# Patient Record
Sex: Male | Born: 1980 | Race: Black or African American | Hispanic: No | Smoking: Never smoker
Health system: Southern US, Community
[De-identification: ages and names within clinical notes are randomized; demographics above are authoritative.]

## PROBLEM LIST (undated history)

## (undated) DIAGNOSIS — R479 Unspecified speech disturbances: Secondary | ICD-10-CM

## (undated) DIAGNOSIS — L910 Hypertrophic scar: Secondary | ICD-10-CM

## (undated) DIAGNOSIS — J45909 Unspecified asthma, uncomplicated: Secondary | ICD-10-CM

## (undated) HISTORY — DX: Hypertrophic scar: L91.0

## (undated) HISTORY — DX: Unspecified speech disturbances: R47.9

## (undated) HISTORY — DX: Unspecified asthma, uncomplicated: J45.909

---

## 2010-12-31 ENCOUNTER — Encounter: Payer: Self-pay | Admitting: Student

## 2010-12-31 ENCOUNTER — Emergency Department (HOSPITAL_BASED_OUTPATIENT_CLINIC_OR_DEPARTMENT_OTHER)
Admission: EM | Admit: 2010-12-31 | Discharge: 2011-01-01 | Disposition: A | Discharge status: Home / Self Care | Payer: Worker's Compensation | Attending: Emergency Medicine | Admitting: Emergency Medicine

## 2010-12-31 ENCOUNTER — Emergency Department (INDEPENDENT_AMBULATORY_CARE_PROVIDER_SITE_OTHER): Payer: Worker's Compensation

## 2010-12-31 DIAGNOSIS — X500XXA Overexertion from strenuous movement or load, initial encounter: Secondary | ICD-10-CM | POA: Insufficient documentation

## 2010-12-31 DIAGNOSIS — M25519 Pain in unspecified shoulder: Secondary | ICD-10-CM

## 2010-12-31 DIAGNOSIS — S4980XA Other specified injuries of shoulder and upper arm, unspecified arm, initial encounter: Secondary | ICD-10-CM | POA: Insufficient documentation

## 2010-12-31 DIAGNOSIS — Y9289 Other specified places as the place of occurrence of the external cause: Secondary | ICD-10-CM | POA: Insufficient documentation

## 2010-12-31 DIAGNOSIS — S46909A Unspecified injury of unspecified muscle, fascia and tendon at shoulder and upper arm level, unspecified arm, initial encounter: Secondary | ICD-10-CM | POA: Insufficient documentation

## 2010-12-31 DIAGNOSIS — Y9269 Other specified industrial and construction area as the place of occurrence of the external cause: Secondary | ICD-10-CM

## 2010-12-31 NOTE — ED Notes (Signed)
Pt reports he is scheduled to have a UDS performed in the morning at another facility. Pt is here tonight for left shoulder pain that was injured while at work .

## 2010-12-31 NOTE — ED Notes (Signed)
WC - pt in with c/o left shoulder pain s/p lifting injury at work tonight. Reports pain to neck as well.

## 2011-01-01 MED ORDER — TRAMADOL HCL 50 MG PO TABS: 50.0000 mg | ORAL_TABLET | Freq: Four times a day (QID) | ORAL | Status: AC | PRN

## 2011-01-01 MED ORDER — TRAMADOL HCL 50 MG PO TABS
50.0000 mg | ORAL_TABLET | Freq: Once | ORAL | Status: AC
  Administered 2011-01-01: 50 mg via ORAL
  Filled 2011-01-01: qty 1

## 2011-01-01 NOTE — ED Provider Notes (Signed)
History     CSN: [id number] Arrival date & time: 12/31/2010 11:03 PM  Chief Complaint  Patient presents with  . Shoulder Injury   Patient is a 30 y.o. male presenting with shoulder injury. The history is provided by the patient. No language interpreter was used.  Shoulder Injury This is a new problem. The current episode started 1 to 2 hours ago. The problem occurs constantly. The problem has not changed since onset.Pertinent negatives include no chest pain, no abdominal pain, no headaches and no shortness of breath. The symptoms are aggravated by nothing. The symptoms are relieved by nothing. He has tried nothing for the symptoms. The treatment provided no relief.  Lifting a 50 pound pallette of fruit at work and heard a pop in the left shoulder.  Pain is a 10/10 no weakness or numbness.  No head injury no additional injuries.    History reviewed. No pertinent past medical history.  History reviewed. No pertinent past surgical history.  History reviewed. No pertinent family history.  History  Substance Use Topics  . Smoking status: Never Smoker   . Smokeless tobacco: Never Used  . Alcohol Use: Yes      Review of Systems  Constitutional: Negative for activity change.  HENT: Negative for facial swelling.   Eyes: Negative for discharge.  Respiratory: Negative for shortness of breath.   Cardiovascular: Negative for chest pain.  Gastrointestinal: Negative for abdominal pain.  Genitourinary: Negative for difficulty urinating.  Musculoskeletal: Positive for arthralgias. Negative for myalgias, back pain and joint swelling.  Neurological: Negative for headaches.  Hematological: Negative for adenopathy.  Psychiatric/Behavioral: Negative for agitation.    Physical Exam  BP 118/99  Pulse 75  Temp(Src) 98 F (36.7 C) (Oral)  Resp 20  SpO2 100%  Physical Exam  Constitutional: He appears well-developed and well-nourished. No distress.  HENT:  Head: Normocephalic and atraumatic.   Eyes: EOM are normal. Pupils are equal, round, and reactive to light.  Neck: Normal range of motion. Neck supple.  Cardiovascular: Normal rate and regular rhythm.   Pulmonary/Chest: Effort normal and breath sounds normal.  Abdominal: Soft. Bowel sounds are normal.  Musculoskeletal: Normal range of motion. He exhibits no edema.       Left shoulder: He exhibits pain. He exhibits no swelling, no effusion, no crepitus, no deformity, no laceration, normal pulse and normal strength.       Negative NEER test of the L shoulder, intact sensation and motor 5/5 of B UE.  2+ bicep/ tricep and brachioradialis reflexes of the LUE. Cap refill in the L hand < 2 sec  No snuff box tenderness of the left wrist    ED Course  Procedures  MDM Need MRI of the left shoulder and orthopedics follow up.  Patient and supervisor express understanding and will follow up with occ med in the AM and appropriate doctors      Carlosdaniel Grob K Spenser Cong-Rasch, MD 01/01/11 [phone]

## 2011-01-01 NOTE — ED Notes (Signed)
Pt's E-prescription was sent to a pharmacy in [place] that pt no longer goes to. Pt states he is no longer going there, and is requesting prescription to be called in to pharmacy in [place]. PT made aware that the closest 24hour [facility] to call in prescription to is in [facility]. Call made and Rx called in. Pt made aware to pick it up at [facility], and that other Rx will be canceled at [place] in the morning once hours are open.

## 2012-03-24 ENCOUNTER — Ambulatory Visit (INDEPENDENT_AMBULATORY_CARE_PROVIDER_SITE_OTHER): Payer: BC Managed Care – PPO | Admitting: Physician Assistant

## 2012-03-24 VITALS — BP 124/72 | HR 67 | Temp 98.1°F | Resp 16 | Ht 68.0 in | Wt 180.0 lb

## 2012-03-24 DIAGNOSIS — S335XXA Sprain of ligaments of lumbar spine, initial encounter: Secondary | ICD-10-CM

## 2012-03-24 DIAGNOSIS — M545 Low back pain: Secondary | ICD-10-CM

## 2012-03-24 DIAGNOSIS — R479 Unspecified speech disturbances: Secondary | ICD-10-CM | POA: Insufficient documentation

## 2012-03-24 DIAGNOSIS — L910 Hypertrophic scar: Secondary | ICD-10-CM | POA: Insufficient documentation

## 2012-03-24 DIAGNOSIS — S39012A Strain of muscle, fascia and tendon of lower back, initial encounter: Secondary | ICD-10-CM

## 2012-03-24 MED ORDER — CYCLOBENZAPRINE HCL 10 MG PO TABS: 10.0000 mg | ORAL_TABLET | Freq: Three times a day (TID) | ORAL | Status: DC | PRN

## 2012-03-24 NOTE — Patient Instructions (Signed)
The cyclobenzaprine (Flexeril) can cause drowsiness, so you should not take it and work or drive.  Use acetaminophen for pain, and apply a warm compress for 15-20 minutes several times each day.  You may also want to consider using an adhesive heating pad, like ThermaCare.
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
## 2012-09-26 IMAGING — CR DG SHOULDER 2+V*L*
3 series · 3 of 3 positions shown · non-contrast
Comparison: None.

CLINICAL DATA: Left shoulder popped while lifting box at work;
diffuse left shoulder pain.

LEFT SHOULDER - 2+ VIEW

[w shoulder ap internal left]
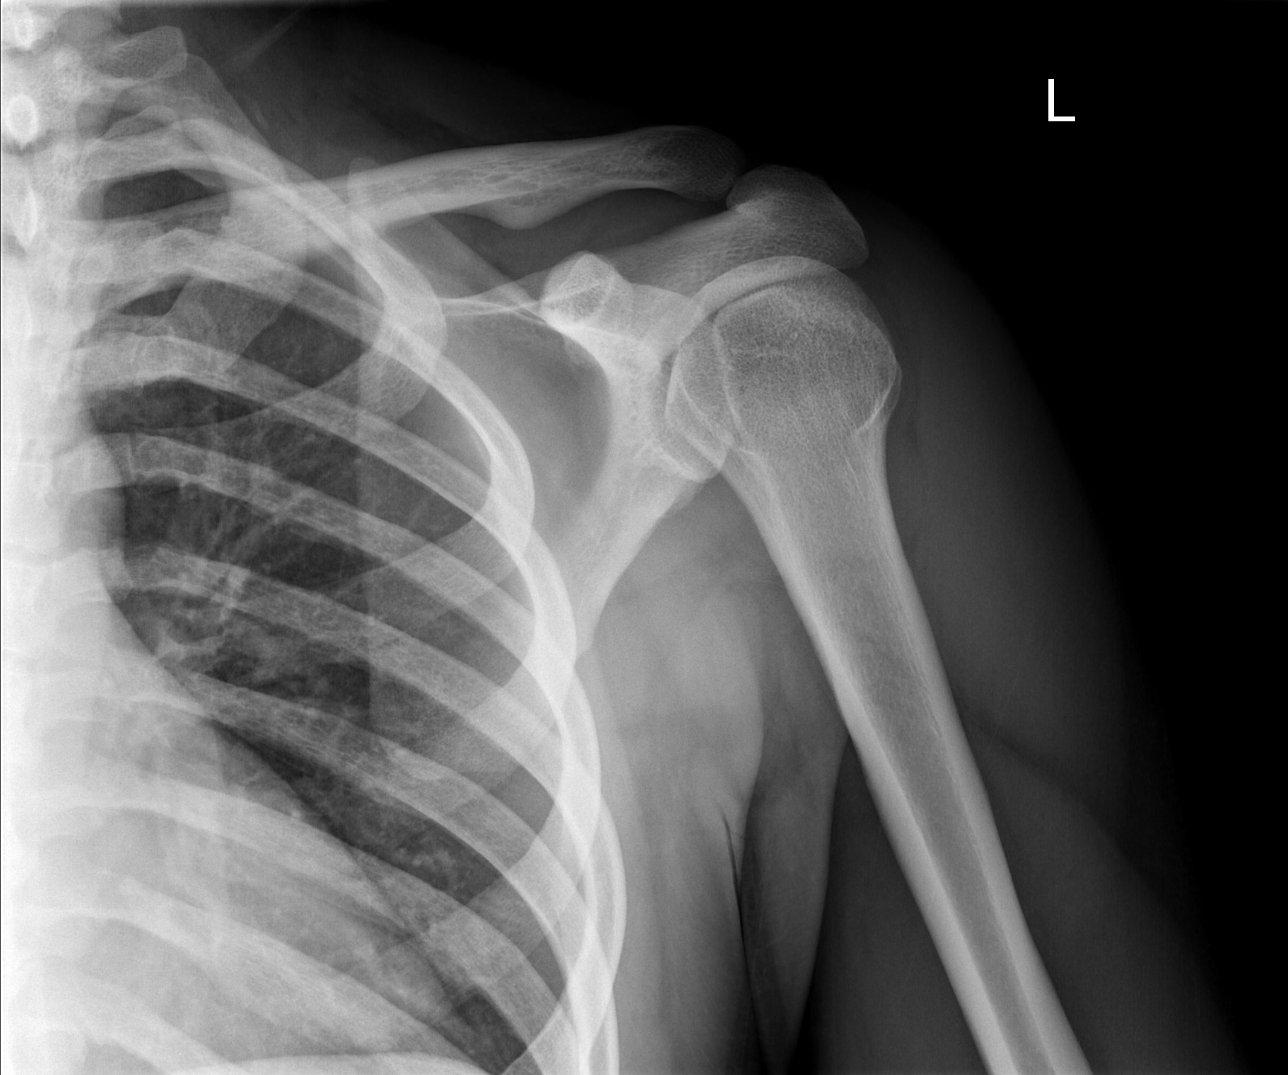

[w shoulder ap external left]
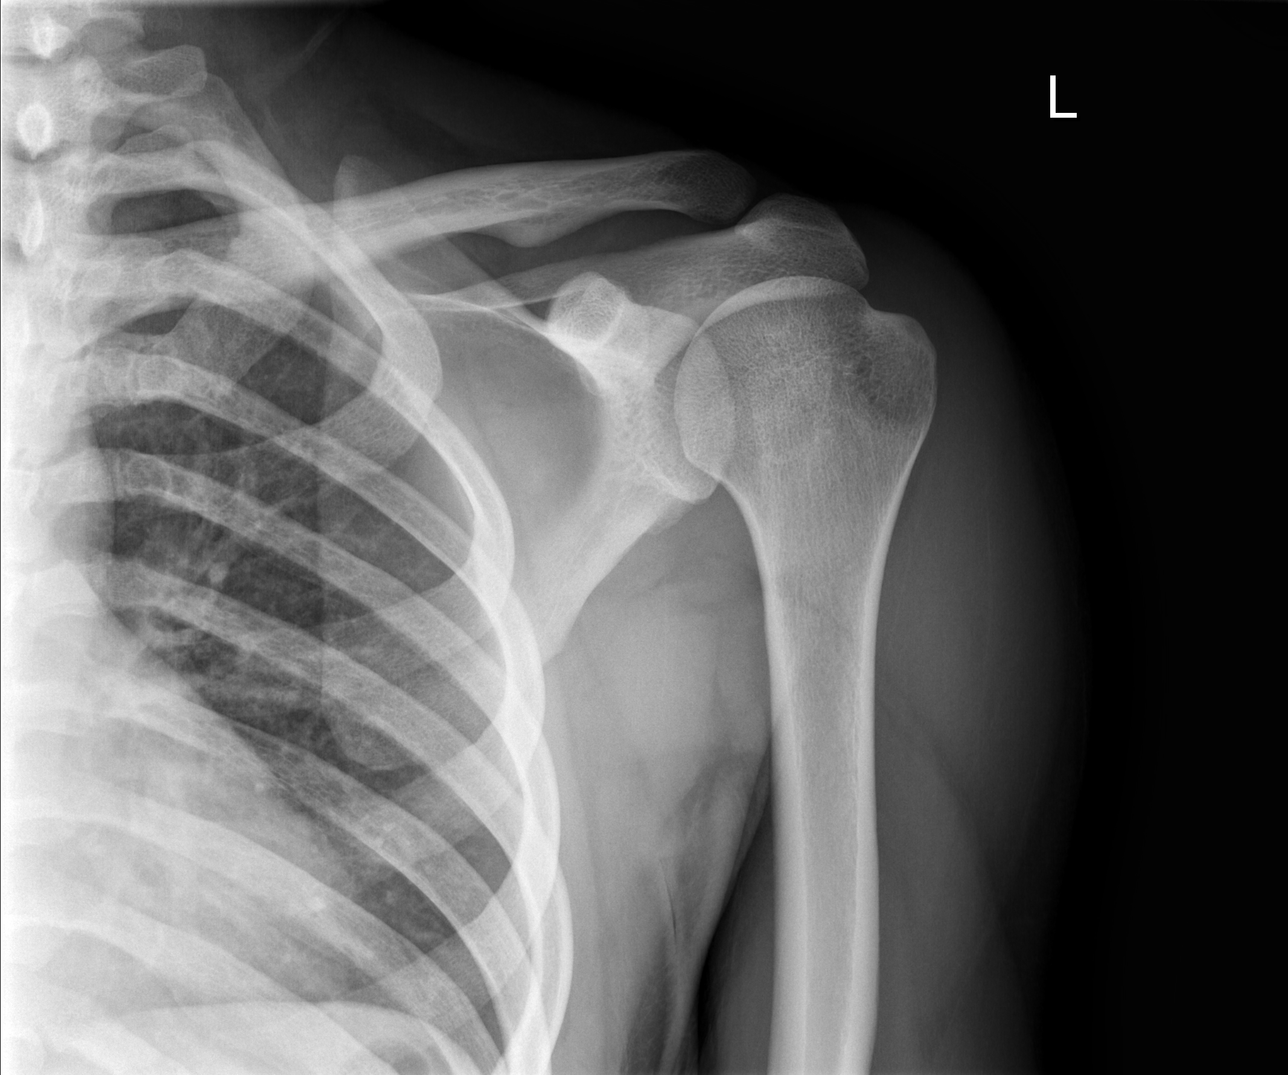

[x shoulder axillary left]
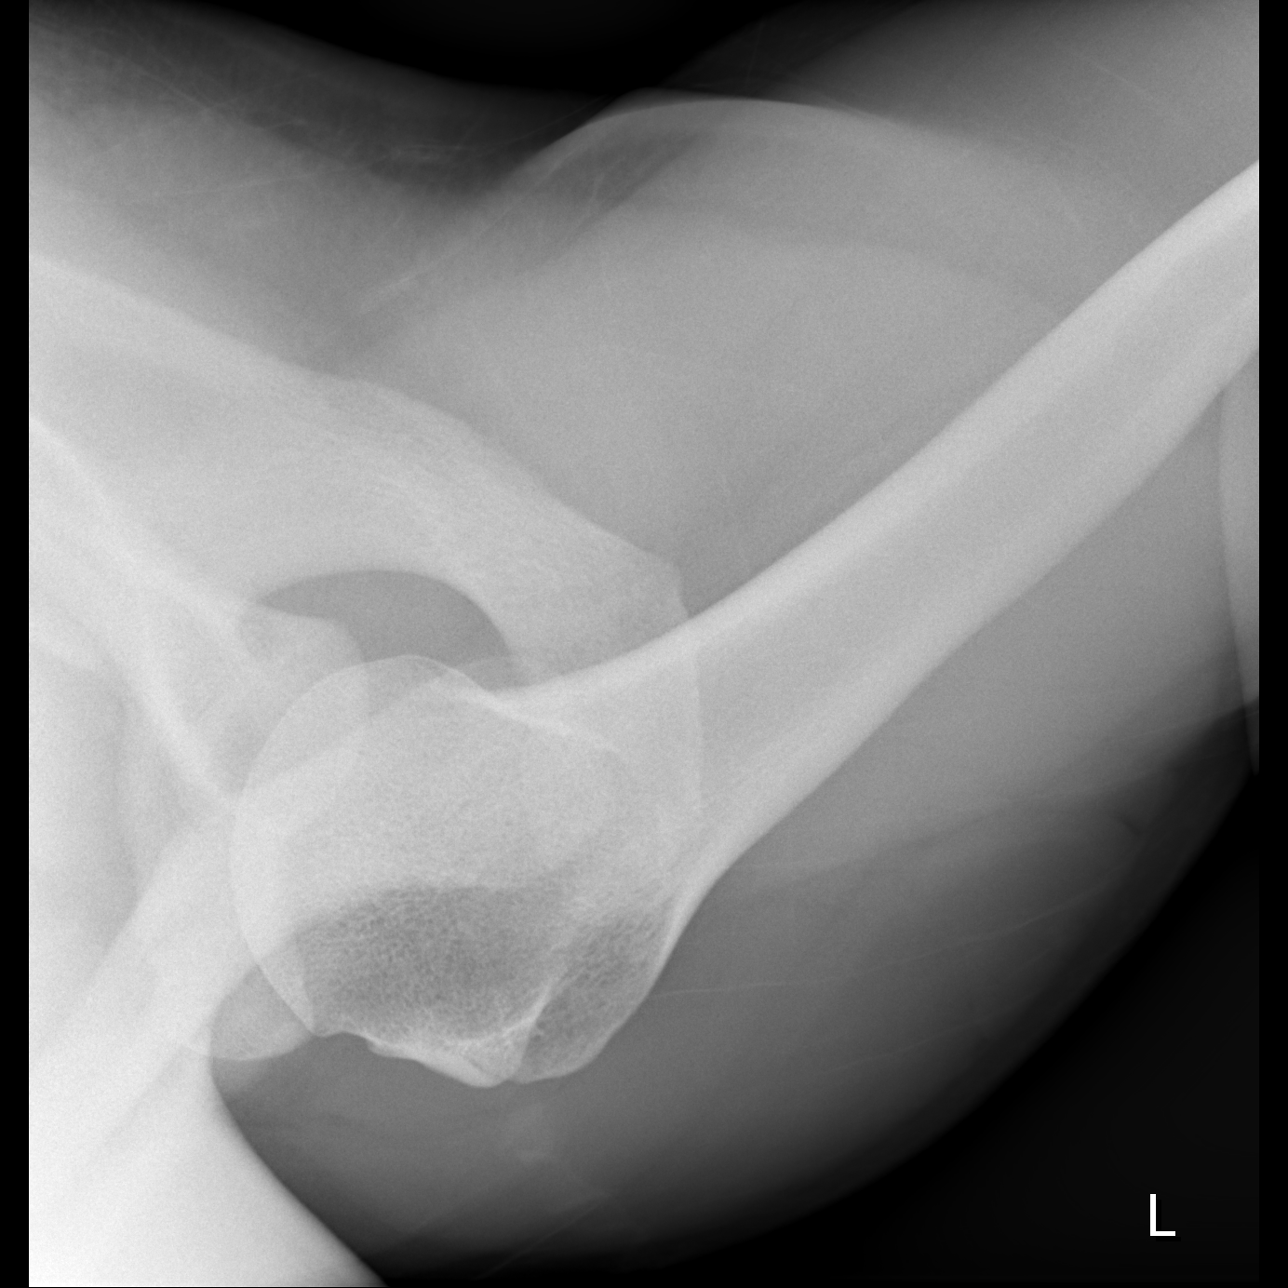

[3 of 3 positions shown; findings below may reference images not displayed]

FINDINGS: There is no evidence of fracture or dislocation.  The
left humeral head is seated within the glenoid fossa.  The
acromioclavicular joint is unremarkable in appearance.  No
significant soft tissue abnormalities are seen.  The visualized
portions of the left lung are clear.
IMPRESSION: No evidence of fracture or dislocation.  Given the clinical
presentation, underlying internal derangement of the shoulder is
suspected.  MRI would be helpful for further evaluation, when and
as deemed clinically appropriate.

## 2013-10-05 ENCOUNTER — Emergency Department (HOSPITAL_COMMUNITY): Payer: Worker's Compensation

## 2013-10-05 ENCOUNTER — Emergency Department (HOSPITAL_COMMUNITY)
Admission: EM | Admit: 2013-10-05 | Discharge: 2013-10-05 | Disposition: A | Discharge status: Home / Self Care | Payer: Worker's Compensation | Attending: Emergency Medicine | Admitting: Emergency Medicine

## 2013-10-05 ENCOUNTER — Encounter (HOSPITAL_COMMUNITY): Payer: Self-pay | Admitting: Emergency Medicine

## 2013-10-05 DIAGNOSIS — Z79899 Other long term (current) drug therapy: Secondary | ICD-10-CM | POA: Insufficient documentation

## 2013-10-05 DIAGNOSIS — N451 Epididymitis: Secondary | ICD-10-CM

## 2013-10-05 DIAGNOSIS — F8089 Other developmental disorders of speech and language: Secondary | ICD-10-CM | POA: Insufficient documentation

## 2013-10-05 DIAGNOSIS — J45909 Unspecified asthma, uncomplicated: Secondary | ICD-10-CM | POA: Insufficient documentation

## 2013-10-05 DIAGNOSIS — N433 Hydrocele, unspecified: Secondary | ICD-10-CM

## 2013-10-05 DIAGNOSIS — N453 Epididymo-orchitis: Secondary | ICD-10-CM | POA: Insufficient documentation

## 2013-10-05 DIAGNOSIS — Z872 Personal history of diseases of the skin and subcutaneous tissue: Secondary | ICD-10-CM | POA: Insufficient documentation

## 2013-10-05 LAB — URINE MICROSCOPIC-ADD ON

## 2013-10-05 LAB — URINALYSIS, ROUTINE W REFLEX MICROSCOPIC
Bilirubin Urine: NEGATIVE
Glucose, UA: NEGATIVE mg/dL
Hgb urine dipstick: NEGATIVE
Ketones, ur: NEGATIVE mg/dL
NITRITE: NEGATIVE
Protein, ur: NEGATIVE mg/dL
SPECIFIC GRAVITY, URINE: 1.025 (ref 1.005–1.030)
UROBILINOGEN UA: 1 mg/dL (ref 0.0–1.0)
pH: 7 (ref 5.0–8.0)

## 2013-10-05 MED ORDER — CEFTRIAXONE SODIUM 250 MG IJ SOLR
250.0000 mg | Freq: Once | INTRAMUSCULAR | Status: AC
  Administered 2013-10-05: 250 mg via INTRAMUSCULAR
  Filled 2013-10-05: qty 250

## 2013-10-05 MED ORDER — IBUPROFEN 800 MG PO TABS
800.0000 mg | ORAL_TABLET | Freq: Once | ORAL | Status: AC
  Administered 2013-10-05: 800 mg via ORAL
  Filled 2013-10-05: qty 1

## 2013-10-05 MED ORDER — LIDOCAINE HCL (PF) 1 % IJ SOLN
INTRAMUSCULAR | Status: AC
  Administered 2013-10-05: 5 mL
  Filled 2013-10-05: qty 5

## 2013-10-05 MED ORDER — DOXYCYCLINE HYCLATE 100 MG PO CAPS: 100.0000 mg | ORAL_CAPSULE | Freq: Two times a day (BID) | ORAL | Status: DC

## 2013-10-05 MED ORDER — DOXYCYCLINE HYCLATE 100 MG PO TABS
100.0000 mg | ORAL_TABLET | Freq: Once | ORAL | Status: AC
  Administered 2013-10-05: 100 mg via ORAL
  Filled 2013-10-05: qty 1

## 2013-10-05 NOTE — ED Notes (Signed)
Pt. Stated, I've had testicular pain and swelling for the last 5 days. I've put ice on it and doesn't help.

## 2013-10-05 NOTE — Discharge Instructions (Signed)
Scrotal US results:  Right epididymis: The tail is enlarged, heterogeneous, and hyperemic.  Left epididymis: Normal in size and appearance.  Hydrocele: Small, right  Varicocele: None visualized.  Pulsed Doppler interrogation of both testes demonstrates low  resistance arterial and venous waveforms bilaterally.  IMPRESSION:  1. Right epididymitis, with small hydrocele.  2. Normal bilateral testes.     - Try elevating your testicles in a position of comfort (see below) - Ibuprofen for pain - Take full dose of antibiotics even if your symptoms improve - Follow up with urology  - Return to the emergency department if you develop any changing/worsening condition, fever, abdominal pain, repeated vomiting, not feeling well or any other concerns (please read additional information regarding your condition below)    Epididymitis Epididymitis is a swelling (inflammation) of the epididymis. The epididymis is a cord-like structure along the back part of the testicle. Epididymitis is usually, but not always, caused by infection. This is usually a sudden problem beginning with chills, fever and pain behind the scrotum and in the testicle. There may be swelling and redness of the testicle. DIAGNOSIS  Physical examination will reveal a tender, swollen epididymis. Sometimes, cultures are obtained from the urine or from prostate secretions to help find out if there is an infection or if the cause is a different problem. Sometimes, blood work is performed to see if your white blood cell count is elevated and if a germ (bacterial) or viral infection is present. Using this knowledge, an appropriate medicine which kills germs (antibiotic) can be chosen by your caregiver. A viral infection causing epididymitis will most often go away (resolve) without treatment. HOME CARE INSTRUCTIONS   Hot sitz baths for 20 minutes, 4 times per day, may help relieve pain.  Only take over-the-counter or prescription  medicines for pain, discomfort or fever as directed by your caregiver.  Take all medicines, including antibiotics, as directed. Take the antibiotics for the full prescribed length of time even if you are feeling better.  It is very important to keep all follow-up appointments. SEEK IMMEDIATE MEDICAL CARE IF:   You have a fever.  You have pain not relieved with medicines.  You have any worsening of your problems.  Your pain seems to come and go.  You develop pain, redness, and swelling in the scrotum and surrounding areas. MAKE SURE YOU:   Understand these instructions.  Will watch your condition.  Will get help right away if you are not doing well or get worse. Document Released: 04/25/2000 Document Revised: 07/21/2011 Document Reviewed: 03/15/2009 [facility] Patient Information 2014 [place], [place].  Hydrocele, Adult Fluid can collect around the testicles. This fluid forms in a sac. This condition is called a hydrocele. The collected fluid causes swelling of the scrotum. Usually, it affects just one testicle. Most of the time, the condition does not cause pain. Sometimes, the hydrocele goes away on its own. Other times, surgery is needed to get rid of the fluid. CAUSES A hydrocele does not develop often. Different things can cause a hydrocele in a man, including:  Injury to the scrotum.  Infection.  X-ray of the area around the scrotum.  A tumor or cancer of the testicle.  Twisting of a testicle.  Decreased blood flow to the scrotum. SYMPTOMS   Swelling without pain. The hydrocele feels like a water-filled balloon.  Swelling with pain. This can occur if the hydrocele was caused by infection or twisting.  Mild discomfort in the scrotum.  The hydrocele  may feel heavy.  Swelling that gets smaller when you lie down. DIAGNOSIS  Your caregiver will do a physical exam to decide if you have a hydrocele. This may include:  Asking questions about your overall health,  today and in the past. Your caregiver may ask about any injuries, X-rays, or infections.  Pushing on your abdomen or asking you to change positions to see if the size of the hydrocele changes.  Shining a light through the scrotum (transillumination) to see if the fluid inside the scrotum is clear.  Blood tests and urine tests to check for infection.  Imaging studies that take pictures of the scrotum and testicles. TREATMENT  Treatment depends in part on what caused the condition. Options include:  Watchful waiting. Your caregiver checks the hydrocele every so often.  Different surgeries to drain the fluid.  A needle may be put into the scrotum to drain fluid (needle aspiration). Fluid often returns after this type of treatment.  A cut (incision) may be made in the scrotum to remove the fluid sac (hydrocelectomy).  An incision may be made in the groin to repair a hydrocele that has contact with abdominal fluids (communicating hydrocele).  Medicines to treat an infection (antibiotics). HOME CARE INSTRUCTIONS  What you need to do at home may depend on the cause of the hydrocele and type of treatment. In general:  Take all medicine as directed by your caregiver. Follow the directions carefully.  Ask your caregiver if there is anything you should not do while you recover (activities, lifting, work, sex).  If you had surgery to repair a communicating hydrocele, recovery time may vary. Ask you caregiver about your recovery time.  Avoid heavy lifting for 4 to 6 weeks.  If you had an incision on the scrotum or groin, wash it for 2 to 3 days after surgery. Do this as long as the skin is closed and there are no gaps in the wound. Wash gently, and avoid rubbing the incision.  Keep all follow-up appointments. SEEK MEDICAL CARE IF:   Your scrotum seems to be getting larger.  The area becomes more and more uncomfortable. SEEK IMMEDIATE MEDICAL CARE IF:  You have a fever. Document  Released: 10/16/2009 Document Revised: 02/16/2013 Document Reviewed: 10/16/2009 [facility] Patient Information 2014 [place], [place].   Emergency Department Resource Guide 1) Find a Doctor and Pay Out of Pocket Although you won't have to find out who is covered by your insurance plan, it is a good idea to ask around and get recommendations. You will then need to call the office and see if the doctor you have chosen will accept you as a new patient and what types of options they offer for patients who are self-pay. Some doctors offer discounts or will set up payment plans for their patients who do not have insurance, but you will need to ask so you aren't surprised when you get to your appointment.  2) Contact Your Local Health Department Not all health departments have doctors that can see patients for sick visits, but many do, so it is worth a call to see if yours does. If you don't know where your local health department is, you can check in your phone book. The CDC also has a tool to help you locate your state's health department, and many state websites also have listings of all of their local health departments.  3) Find a Walk-in Clinic If your illness is not likely to be very severe or complicated, you  may want to try a walk in clinic. These are popping up all over the country in pharmacies, drugstores, and shopping centers. They're usually staffed by nurse practitioners or physician assistants that have been trained to treat common illnesses and complaints. They're usually fairly quick and inexpensive. However, if you have serious medical issues or chronic medical problems, these are probably not your best option.  No Primary Care Doctor: - Call Health Connect at  [phone] - they can help you locate a primary care doctor that  accepts your insurance, provides certain services, etc. - Physician Referral Service- [phone]  Chronic Pain Problems: Organization         Address  Phone    Notes  [facility]  [phone]) [phone] Patients need to be referred by their primary care doctor.   Medication Assistance: Organization         Address  Phone   Notes  [facility] Medication [facility] [address] [place], [place] [address] [phone] --Must be a resident of [facility] [facility] [place] -- Must have NO insurance coverage whatsoever (no Medicaid/ Medicare, etc.) -- The pt. MUST have a primary care doctor that directs their care regularly and follows them in the community   [facility]ist  [phone]   [employer]  [phone]    Agencies that provide inexpensive medical care: Organization         Address  Phone   Notes  [facility]  [phone]   [facility]    [phone](336) [phone]   [facility] [address] [place], [place] [address] [phone]) [phone]   [facility] [address], [place] [phone]   Planned Parenthood    [phone]   [facility]    [phone]   [facility]  [address], [place] Phone:  [phone]) [phone], Fax:  [phone] Hours of Operation:  9 am - 6 pm, M-F.  Also accepts Medicaid/Medicare and self-pay.  [facility]  [address], [place] Phone: [phone], Fax: [phone]. Hours of Operation:  8:30 am - 5:30 pm, M-F.  Also accepts Medicaid and self-pay.  [facility] [place] [address], [place] Phone: [phone]) [phone]   [facility] [address], [place], [place] [phone], Ext. [phone] Mondays & Thursdays: 7-9 AM.  First 15 patients are seen on a first come, first serve basis.    Medicaid-accepting [facility] Providers:  Organization         Address  Phone   Notes  [facility] [address], [place] [phone] Also accepts self-pay patients.  [facility]  [address], [place]  [phone](336) [phone]   [facility] [address], [place] [phone]   [facility] [address] [place] [address], [place] [phone]   Renaye RakersVeita Bland [address], [place]   [phone]) [phone] Only accepts [place] Access [place] patients after they have their name applied to their card.   Self-Pay (no insurance) in [facility]:  Organization         Address  Phone   Notes  Sickle Cell Patients, [facility] [address], [place] [phone]) [phone]   [facility] [address] [place] [address], [place] (  336) [phone]   [facility] [place]  [address], [place] [phone]   [facility]/Dr. Osei-Bonsu  [address] [place] or [address], [place] [phone] Phone number for both [place] and [place] locations is the same.  [facility] [address], [place] [phone]   [facility] [address], [place] or [address] [phone]   [facility] [address] [place] [phone], phone; [phone], fax Sees patients 1st and 3rd Saturday of every month.  Must not qualify for public or private insurance (i.e. Medicaid, Medicare, [place] Health Choice, Veterans' Benefits)  Household income should be no more than 200% of the poverty level The clinic cannot treat you if you are pregnant or think you are pregnant  Sexually transmitted diseases are not treated at the clinic.    Dental Care: Organization         Address  Phone  Notes  [facility] [address] [place], [place] [phone] Accepts children up to age 34 who are enrolled in [place] or [place] Health Choice; pregnant women with a Medicaid card; and children who have  applied for Medicaid or [place] Health Choice, but were declined, whose parents can pay a reduced fee at time of service.  [facility]  [address], [place] [phone] Accepts children up to age 66 who are enrolled in [place] or [place] Health Choice; pregnant women with a Medicaid card; and children who have applied for Medicaid or [place] Health Choice, but were declined, whose parents can pay a reduced fee at time of service.  [facility]  [address] [place], [place] [phone] Patients are seen by appointment only. Walk-ins are not accepted. [facility] will see patients 58 years of age and older. Monday - Tuesday (8am-5pm) Most Wednesdays (8:30-5pm) $30 per visit, cash only  [facility] Access PROGRAM  [address], [facility] [phone] Patients are seen by appointment only. Walk-ins are not accepted. [facility] will see patients 64 years of age and older. One Wednesday Evening (Monthly: Volunteer Based).  $30 per visit, cash only  [employer]  [phone] for adults; Children under age 49, call Graduate Pediatric Dentistry at [phone]. Children aged 48-14, please call [phone] to request a pediatric application.  Dental services are provided in all areas of dental care including fillings, crowns and bridges, complete and partial dentures, implants, gum treatment, root canals, and extractions. Preventive care is also provided. Treatment is provided to both adults and children. Patients are selected via a lottery and there is often a waiting list.   [facility] [address], [place]  [phone] [email or web address]   [facility] [address] [place], [place] [phone], Ext. [phone] Second and Fourth Thursday of each month, opens at 6:30 AM; Clinic ends at 9 AM.  Patients are seen on a first-come first-served basis, and a  limited number are seen during each clinic.   [facility]  [address] [place], [place] [phone]   Eligibility Requirements You must have lived in [place], [place], or [place] counties for at least the last three months.   You cannot be eligible for  state or Teacher, music, including [employer], [place], or [employer].   You generally cannot be eligible for healthcare insurance through your employer.    How to apply: Eligibility screenings are held every Tuesday and Wednesday afternoon from 1:00 pm until 4:00 pm. You do not need an appointment for the interview!  [facility] [address], [place], [place] [phone]   [facility] Health Department  [phone]   [facility] Health Department  [phone]   [facility] Health Department  [phone]    Behavioral Health Resources in the Community: Intensive Outpatient Programs Organization         Address  Phone  Notes  [facility] [address] [place] [employer] [place], [place] [phone]   [facility] [address], [place], [place] [phone]   [facility] [address] [place], [place]  [phone]   [facility] Mental Health [address]  [place], [place] [phone] or [phone]   Substance Abuse Resources Organization         Address  Phone  Notes  [facility] Drug [facility]  [phone]   [facility]  [phone]   [facility] [place]  [phone]   Floydene Flock  [phone]   Residential & Outpatient Substance Abuse Program  4791489386   Psychological Services Organization         Address  Phone  Notes  [facility] Behavioral Health  [phone]   [facility]  [phone]   [facility] Mental Health [address] [place] [phone] or [phone]    Mobile Crisis Teams Organization          Address  Phone  Notes  [facility], Mobile Crisis Care Unit  [phone]   [facility]  [address] [place], [place] [phone]   Doristine Locks [address] [place] [phone]    Self-Help/Support Groups Organization         Address  Phone             Notes  [facility] [place] - variety of support groups  [phone]- I7437963 Call for more information  Narcotics Anonymous (NA), [facility] [address], [employer] [place]  2 meetings at this location   Statistician         Address  Phone  Notes  [facility] [address],    [place]  [phone]   [facility]  [address], [place] [address], [place], [place] [phone]   [facility] Treatment Facility [address] [place], [place] [phone] Admissions: 8am-3pm M-F  [facility] [address]    [place], [place] [phone]   [facility] [address] [place], [place] [phone]   [facility] [address]  [place], [place] [phone]   [facility] - Intensive Outpatient [address], [place], [place] [phone]   [facility] (Addiction Recovery [facility] [address] [place]  [place], [place] [phone] or [phone]   [facility] (RTS) [address], [place], [place] [phone] Accepts Medicaid  [facility] [address]  [place] [phone] Substance Abuse/Addiction Treatment   [facility] Organization         Address  Phone  Notes  [facility]  [phone]   Angie Fava, PhD [address] [place], [place]   [phone]  [phone] or [phone]) [phone]   [facility]   [address] [place], [place] [phone]   [facility] Recovery [address] [place], [place] [phone] Insurance/Medicaid/sponsorship  through [facility] and Families [address]                                    [place], [place] [phone] Therapy/tele-psych/case  [facility] [address]   [place], [place] [phone]    Dr. Lolly Mustache  [phone]   [facility] [place]  [employer] [facility] Dept. 1) [address] [place] 2) [address] [place] 3)  [address], [place] [phone]  [phone]   [facility] Child Abuse Hotline [phone] or [phone] (After Hours)

## 2013-10-05 NOTE — ED Notes (Signed)
Pt not returned yet from [place], called [place], pt enroute back.

## 2013-10-05 NOTE — ED Provider Notes (Signed)
CSN: [id number]     Arrival date & time 10/05/13  1750 History   First MD Initiated Contact with Patient 10/05/13 2018     Chief Complaint  Patient presents with  . Testicle Pain  . Groin Swelling   HPI  Braeden Elsie LincolnGamble is a 33 y.o. male with a PMH of asthma, keloid skin disorder, and speech impediment who presents to the ED for evaluation of testicular pain and groin swelling. History was provided by the patient. Patient has had gradually worsening constant right testicular pain for the past 5 days which is described as a throbbing pain. Pain worse with walking and palpation. Tried applying ice with no improvements. No genital sores, dysuria, abdominal pain, penile pain or discharge. No trauma or injuries. Associated symptoms include scrotal edema. No previous hx of this in the past. Patient currently sexually active with one male partner with no recent new partners. No concerns or hx of STD's. No fever, chills, change in appetite/activity, nausea, vomiting or other concerns.    Past Medical History  Diagnosis Date  . Asthma   . Speech impediment   . Keloid skin disorder     nuchal region   History reviewed. No pertinent past surgical history. Family History  Problem Relation Age of Onset  . Diabetes Mother   . Cancer Maternal Grandmother     breast cancer x 2   History  Substance Use Topics  . Smoking status: Never Smoker   . Smokeless tobacco: Never Used  . Alcohol Use: 1.2 oz/week    2 Cans of beer per week    Review of Systems  Constitutional: Negative for fever, activity change, appetite change and fatigue.  Gastrointestinal: Negative for nausea, vomiting, abdominal pain, diarrhea, constipation and rectal pain.  Genitourinary: Positive for scrotal swelling and testicular pain. Negative for dysuria, urgency, frequency, hematuria, flank pain, decreased urine volume, penile swelling, difficulty urinating, genital sores and penile pain.  Musculoskeletal: Negative for back pain  and myalgias.  Skin: Negative for rash and wound.  Neurological: Negative for dizziness, weakness, light-headedness and headaches.    Allergies  Shellfish allergy; Ibuprofen; and Naproxen sodium  Home Medications   Prior to Admission medications   Medication Sig Start Date End Date Taking? Authorizing Provider  albuterol (PROVENTIL,VENTOLIN) 90 MCG/ACT inhaler Inhale 2 puffs into the lungs every 6 (six) hours as needed. Shortness of breath and wheezing     Historical Provider, MD  cyclobenzaprine (FLEXERIL) 10 MG tablet Take 1 tablet (10 mg total) by mouth 3 (three) times daily as needed for muscle spasms. 03/24/12   Chelle S Jeffery, PA-C   BP 117/76  Pulse 72  Temp(Src) 98.1 F (36.7 C) (Oral)  Resp 16  Ht 5\' 8"  (1.727 m)  Wt 203 lb 7 oz (92.279 kg)  BMI 30.94 kg/m2  SpO2 100%  Filed Vitals:   10/05/13 2100 10/05/13 2221 10/05/13 2256 10/05/13 2259  BP: 139/66 114/75 103/67   Pulse: 66 69  65  Temp:      TempSrc:      Resp:  18    Height:      Weight:      SpO2: 99% 100% 98% 98%    Physical Exam  Nursing note and vitals reviewed. Constitutional: He is oriented to person, place, and time. He appears well-developed and well-nourished. No distress.  Non-toxic  HENT:  Head: Normocephalic and atraumatic.  Right Ear: External ear normal.  Left Ear: External ear normal.  Nose: Nose normal.  Mouth/Throat:  Oropharynx is clear and moist.  Eyes: Conjunctivae are normal. Right eye exhibits no discharge. Left eye exhibits no discharge.  Neck: Normal range of motion. Neck supple.  Cardiovascular: Normal rate, regular rhythm and normal heart sounds.  Exam reveals no gallop and no friction rub.   No murmur heard. Pulmonary/Chest: Effort normal and breath sounds normal. No respiratory distress. He has no wheezes. He has no rales.  Abdominal: Soft. Bowel sounds are normal. He exhibits no distension and no mass. There is no tenderness. There is no rebound and no guarding.   Genitourinary:  Diffuse mild right testicle tenderness to palpation. No tenderness to palpation to the left testicle. Negative Phren's sign. Right scrotum > left. Medial side of the right testicle is edematous and soft mobile mass approximately 1 x 2 cm palpated. No erythema or warmth to the testicles bilaterally. No genital sores. No penile edema or discharge. No inguinal hernias bilaterally. No inguinal LAD.   Musculoskeletal: Normal range of motion. He exhibits no edema and no tenderness.  Neurological: He is alert and oriented to person, place, and time.  Skin: Skin is warm and dry. He is not diaphoretic.     ED Course  Procedures (including critical care time) Labs Review Labs Reviewed - No data to display  Imaging Review US Scrotum  10/05/2013   CLINICAL DATA:  test pain; TESTICLE PAIN GROIN SWELLING, right  EXAM: SCROTAL ULTRASOUND  DOPPLER ULTRASOUND OF THE TESTICLES  TECHNIQUE: Complete ultrasound examination of the testicles, epididymis, and other scrotal structures was performed. Color and spectral Doppler ultrasound were also utilized to evaluate blood flow to the testicles.  COMPARISON:  None.  FINDINGS: Right testicle  Measurements: 34 x 22 x23 mm. Normal color Doppler signal. Arterial and venous waveforms are recorded. No mass or microlithiasis visualized.  Left testicle  Measurements: 35 x 19 x 22 mm. Normal color Doppler signal. Arterial and venous waveforms are recorded. No mass or microlithiasis visualized.  Right epididymis: The tail is enlarged, heterogeneous, and hyperemic.  Left epididymis:  Normal in size and appearance.  Hydrocele:  Small, right  Varicocele:  None visualized.  Pulsed Doppler interrogation of both testes demonstrates low resistance arterial and venous waveforms bilaterally.  IMPRESSION: 1. Right epididymitis, with small hydrocele. 2. Normal bilateral testes.   Electronically Signed   By: Oley Balm M.D.   On: 10/05/2013 21:55   [place] Art/ven Flow Abd Pelv  Doppler  10/05/2013   CLINICAL DATA:  test pain; TESTICLE PAIN GROIN SWELLING, right  EXAM: SCROTAL ULTRASOUND  DOPPLER ULTRASOUND OF THE TESTICLES  TECHNIQUE: Complete ultrasound examination of the testicles, epididymis, and other scrotal structures was performed. Color and spectral Doppler ultrasound were also utilized to evaluate blood flow to the testicles.  COMPARISON:  None.  FINDINGS: Right testicle  Measurements: 34 x 22 x23 mm. Normal color Doppler signal. Arterial and venous waveforms are recorded. No mass or microlithiasis visualized.  Left testicle  Measurements: 35 x 19 x 22 mm. Normal color Doppler signal. Arterial and venous waveforms are recorded. No mass or microlithiasis visualized.  Right epididymis: The tail is enlarged, heterogeneous, and hyperemic.  Left epididymis:  Normal in size and appearance.  Hydrocele:  Small, right  Varicocele:  None visualized.  Pulsed Doppler interrogation of both testes demonstrates low resistance arterial and venous waveforms bilaterally.  IMPRESSION: 1. Right epididymitis, with small hydrocele. 2. Normal bilateral testes.   Electronically Signed   By: Oley Balm M.D.   On: 10/05/2013 21:55  EKG Interpretation None      Results for orders placed during the hospital encounter of 10/05/13  URINALYSIS, ROUTINE W REFLEX MICROSCOPIC      Result Value Ref Range   Color, Urine YELLOW  YELLOW   APPearance CLOUDY (*) CLEAR   Specific Gravity, Urine 1.025  1.005 - 1.030   pH 7.0  5.0 - 8.0   Glucose, UA NEGATIVE  NEGATIVE mg/dL   Hgb urine dipstick NEGATIVE  NEGATIVE   Bilirubin Urine NEGATIVE  NEGATIVE   Ketones, ur NEGATIVE  NEGATIVE mg/dL   Protein, ur NEGATIVE  NEGATIVE mg/dL   Urobilinogen, UA 1.0  0.0 - 1.0 mg/dL   Nitrite NEGATIVE  NEGATIVE   Leukocytes, UA MODERATE (*) NEGATIVE  URINE MICROSCOPIC-ADD ON      Result Value Ref Range   WBC, UA TOO NUMEROUS TO COUNT  <3 WBC/hpf   RBC / HPF 0-2  <3 RBC/hpf   Bacteria, UA FEW (*) RARE      MDM   Galo Szczepanik is a 33 y.o. male with a PMH of asthma, keloid skin disorder, and speech impediment who presents to the ED for evaluation of testicular pain and groin swelling, which is likely due to a right epididymitis. Patient also found to have a hydrocele. No torsion. Patient treated with IM Rocephin and doxy in the ED and will be discharged on doxy. UA suggestive of a possible UTI. Patient not symptomatic. No dysuria. Will send for culture. Abdominal exam benign. Patient afebrile and non-toxic in appearance. Instructed patient to follow-up with urology. Return precautions, discharge instructions, and follow-up was discussed with the patient before discharge.       Discharge Medication List as of 10/05/2013 10:56 PM    START taking these medications   Details  doxycycline (VIBRAMYCIN) 100 MG capsule Take 1 capsule (100 mg total) by mouth 2 (two) times daily., Starting 10/05/2013, Until Discontinued, Print         Final impressions: 1. Epididymitis, right   2. Hydrocele, right       Luiz Iron PA-C         Jillyn Ledger, PA-C 10/06/13 [id number]

## 2013-10-06 LAB — GC/CHLAMYDIA PROBE AMP
CT PROBE, AMP APTIMA: POSITIVE — AB
GC PROBE AMP APTIMA: POSITIVE — AB

## 2013-10-07 LAB — URINE CULTURE
CULTURE: NO GROWTH
Colony Count: NO GROWTH

## 2013-10-08 ENCOUNTER — Telehealth (HOSPITAL_BASED_OUTPATIENT_CLINIC_OR_DEPARTMENT_OTHER): Payer: Self-pay | Admitting: Emergency Medicine

## 2013-10-08 NOTE — ED Provider Notes (Signed)
Medical screening examination/treatment/procedure(s) were performed by non-physician practitioner and as supervising physician I was immediately available for consultation/collaboration.   EKG Interpretation None        Kristen N Ward, DO 10/08/13 0708 

## 2013-10-08 NOTE — Telephone Encounter (Signed)
+  Chlamydia. +Gonorrhea. Patient treated with Rocephin and Doxycycline. [facility] faxed.

## 2015-07-02 IMAGING — US US ART/VEN ABD/PELV/SCROTUM DOPPLER LTD
1 series · 14 of 25 positions shown · non-contrast
Comparison: None.

CLINICAL DATA: test pain; TESTICLE PAIN GROIN SWELLING, right

EXAM:
SCROTAL ULTRASOUND
DOPPLER ULTRASOUND OF THE TESTICLES
TECHNIQUE: Complete ultrasound examination of the testicles, epididymis, and
other scrotal structures was performed. Color and spectral Doppler
ultrasound were also utilized to evaluate blood flow to the
testicles.

[Series 1: us art/ven abd/pelv/scrotum doppler ltd · 0.06mm/px · 14 of 33 slices shown]
[im 1/33]
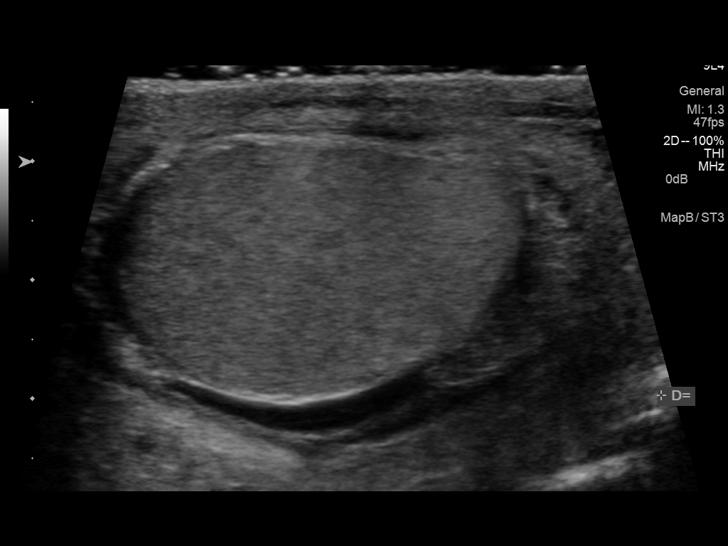
[im 3/33]
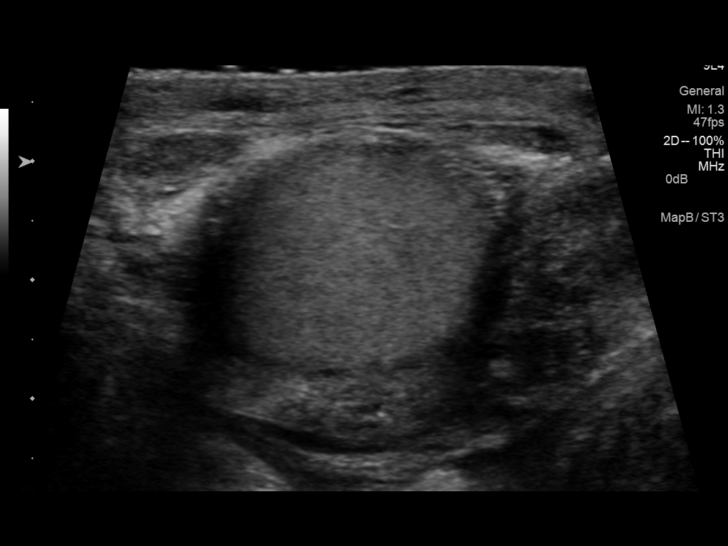
[im 6/33]
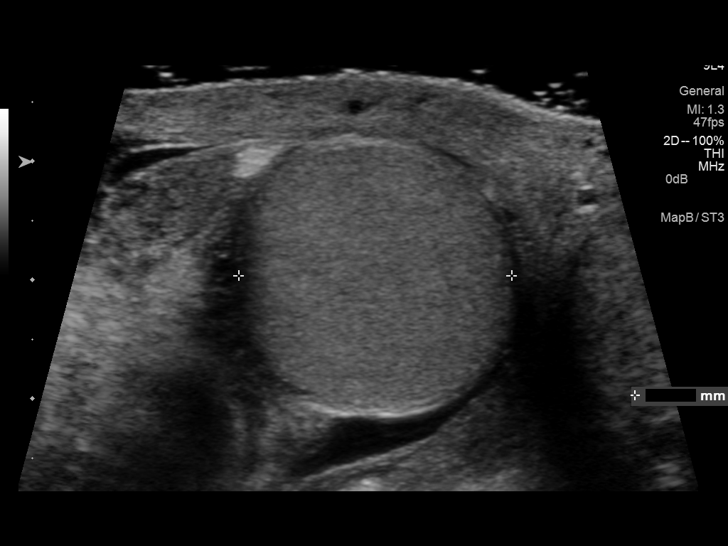
[im 9/33]
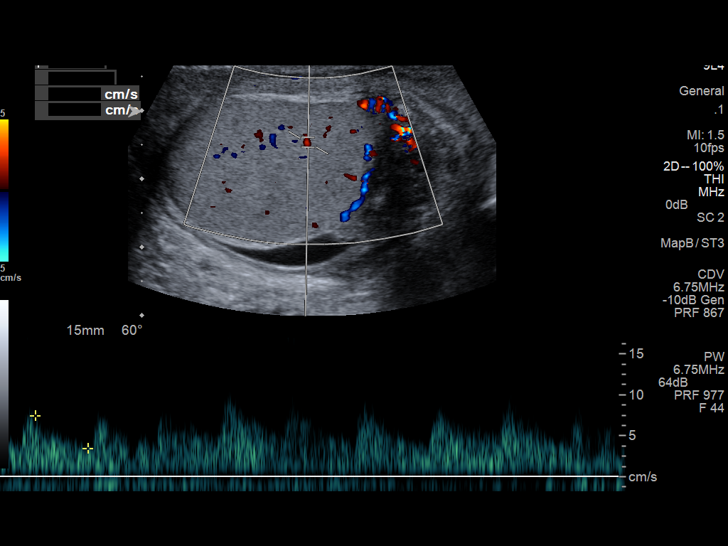
[im 11/33]
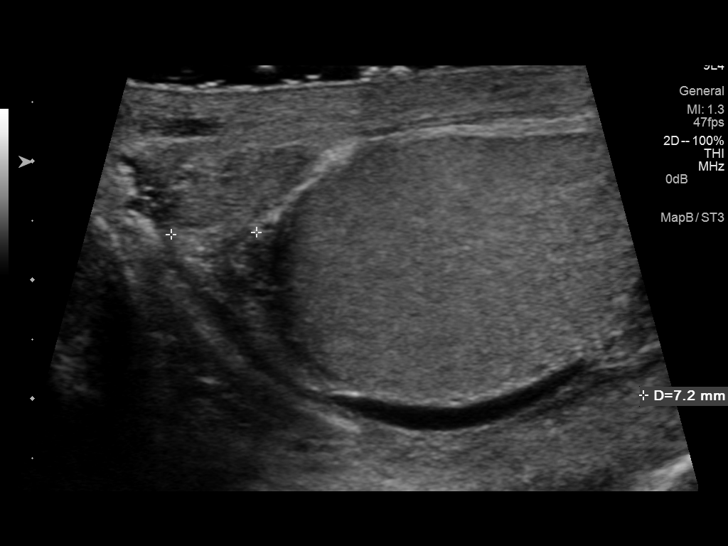
[im 13/33]
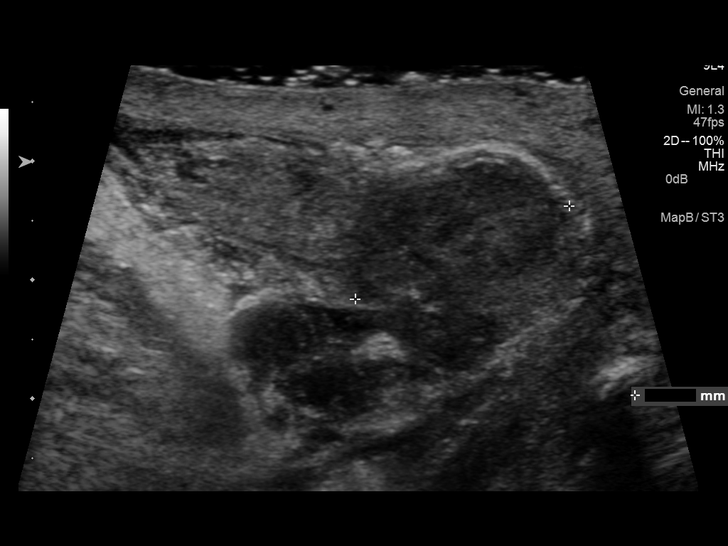
[im 15/33]
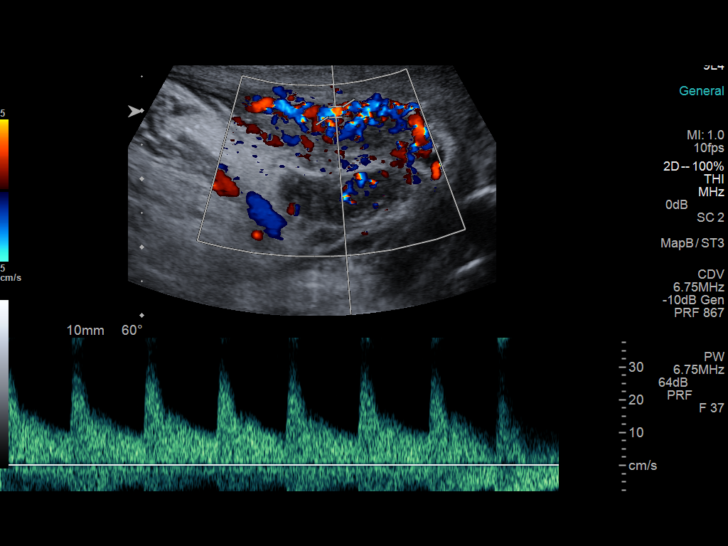
[im 18/33]
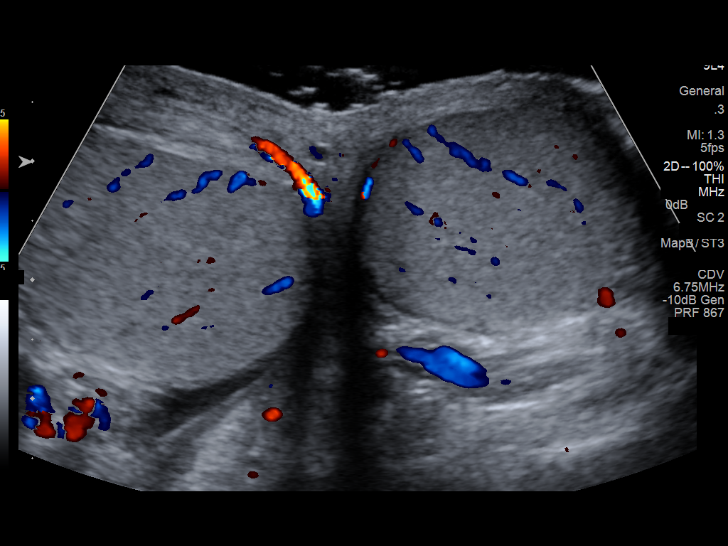
[im 21/33]
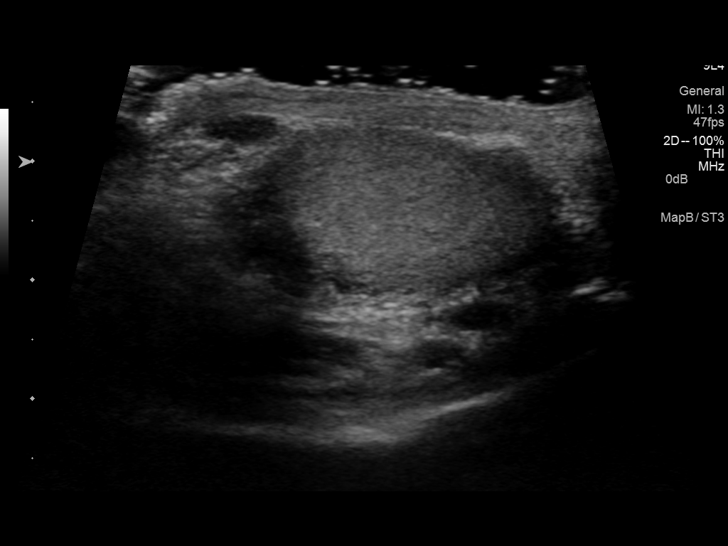
[im 22/33]
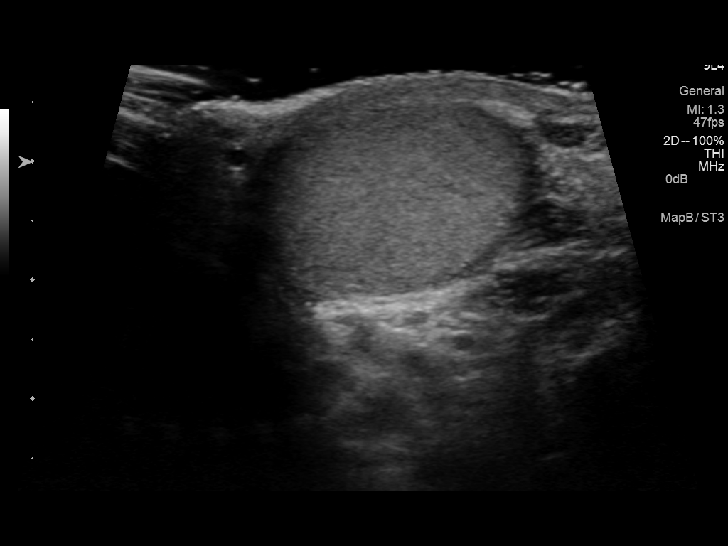
[im 25/33]
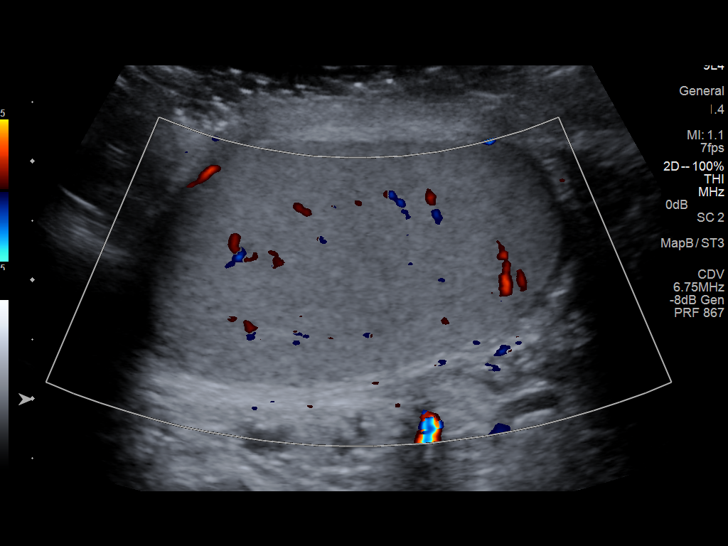
[im 27/33]
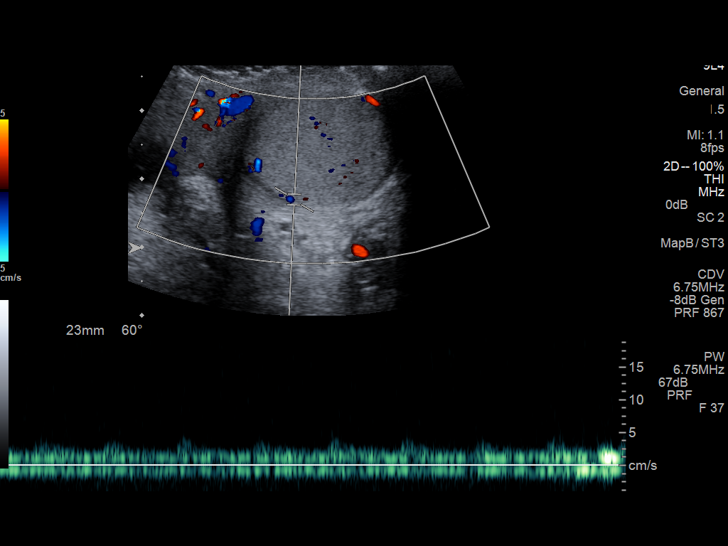
[im 30/33]
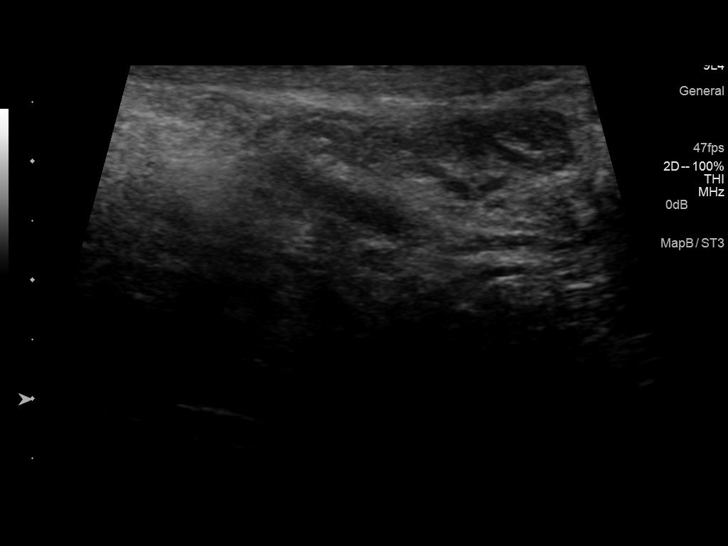
[im 33/33]
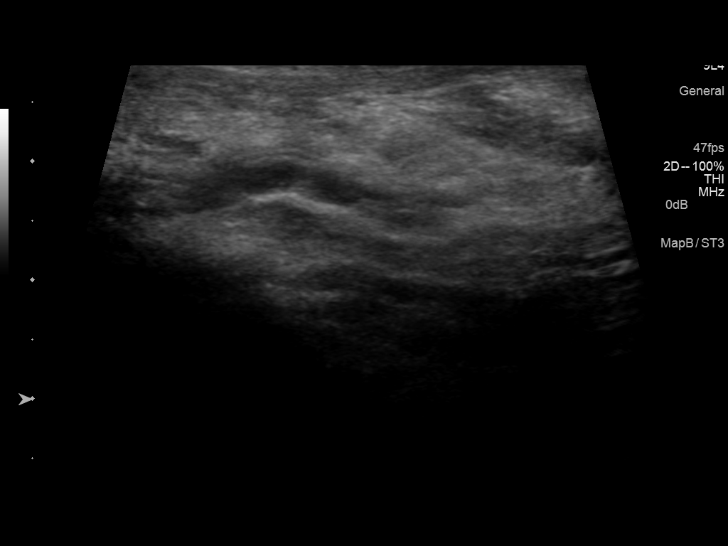

[14 of 25 positions shown; findings below may reference images not displayed]

FINDINGS: Right testicle

Measurements: 34 x 22 x23 mm. Normal color Doppler signal. Arterial
and venous waveforms are recorded. No mass or microlithiasis
visualized.

Left testicle

Measurements: 35 x 19 x 22 mm. Normal color Doppler signal. Arterial
and venous waveforms are recorded.. No mass or microlithiasis
visualized.

Right epididymis: The tail is enlarged, heterogeneous, and
hyperemic.

Left epididymis:  Normal in size and appearance.

Hydrocele:  Small, right

Varicocele:  None visualized.

Pulsed Doppler interrogation of both testes demonstrates low
resistance arterial and venous waveforms bilaterally.
IMPRESSION: 1. Right epididymitis, with small hydrocele.
2. Normal bilateral testes.

## 2018-05-18 ENCOUNTER — Institutional Professional Consult (permissible substitution): Payer: Self-pay | Admitting: Plastic Surgery

## 2018-06-04 ENCOUNTER — Ambulatory Visit (INDEPENDENT_AMBULATORY_CARE_PROVIDER_SITE_OTHER): Payer: BLUE CROSS/BLUE SHIELD | Admitting: Plastic Surgery

## 2018-06-04 ENCOUNTER — Encounter: Payer: Self-pay | Admitting: Plastic Surgery

## 2018-06-04 VITALS — BP 118/78 | HR 92 | Temp 98.5°F | Ht 68.0 in | Wt 225.0 lb

## 2018-06-04 DIAGNOSIS — L910 Hypertrophic scar: Secondary | ICD-10-CM | POA: Diagnosis not present

## 2018-06-04 NOTE — Progress Notes (Signed)
Patient ID: Benjamin Gomez, male    DOB: August 31, 1980, 38 y.o.   MRN: [id number]   Chief Complaint  Patient presents with  . Advice Only    for keloids on the back of head    The patient is a 38 year old black male here with his wife for evaluation of his keloids.  He has 3 large keloids on the back of his head on the inferior aspect of the occipital area.  He states these have been there for a couple of years but are getting rapidly larger.  They started off as small areas that he tried to pick off.  As he did that they got larger.  They spanned a 5 x 8 cm area.  They are large and have a little bit of pedunculation. He has 2 keloids on his chest area 1 over the sternum.  And 1 at the lateral aspect of the right breast.  These have not gotten any injections in the past.  They are 1 x 3 cm in size.  They are all raised.  None of them look infected.  He has multiple other scars from playing sports on his arms and none of these have hypertrophic or keloid scars.   Review of Systems  Constitutional: Negative.   HENT: Negative.   Eyes: Negative.   Respiratory: Negative.  Negative for chest tightness.   Cardiovascular: Negative.  Negative for leg swelling.  Gastrointestinal: Negative.   Genitourinary: Negative.   Musculoskeletal: Negative.   Skin: Negative for pallor and wound.  Psychiatric/Behavioral: Negative.     Past Medical History:  Diagnosis Date  . Asthma   . Keloid skin disorder    nuchal region  . Speech impediment     History reviewed. No pertinent surgical history.    Current Outpatient Medications:  .  EPINEPHrine 0.3 mg/0.3 mL IJ SOAJ injection, Inject into the muscle., Disp: , Rfl:  .  acetaminophen (TYLENOL) 325 MG tablet, Take 325 mg by mouth every 6 (six) hours as needed for moderate pain., Disp: , Rfl:  .  albuterol (PROVENTIL,VENTOLIN) 90 MCG/ACT inhaler, Inhale 2 puffs into the lungs every 6 (six) hours as needed. Shortness of breath and wheezing , Disp: , Rfl:      Objective:   Vitals:   06/04/18 1448  BP: 118/78  Pulse: 92  Temp: 98.5 F (36.9 C)  SpO2: 97%    Physical Exam Vitals signs and nursing note reviewed.  Constitutional:      Appearance: Normal appearance.  HENT:     Head: Normocephalic and atraumatic.      Nose: Nose normal.  Eyes:     Extraocular Movements: Extraocular movements intact.     Pupils: Pupils are equal, round, and reactive to light.  Cardiovascular:     Rate and Rhythm: Normal rate.  Pulmonary:     Effort: Pulmonary effort is normal.  Abdominal:     General: Abdomen is flat.  Skin:    General: Skin is warm.     Capillary Refill: Capillary refill takes less than 2 seconds.  Neurological:     General: No focal deficit present.     Mental Status: He is alert.  Psychiatric:        Mood and Affect: Mood normal.        Thought Content: Thought content normal.        Judgment: Judgment normal.     Assessment & Plan:  Keloid skin disorder We discussed the options for  treatment of keloid.  I explained that excision alone would be risky for recurrence.  He will need to have more than just excision.  Excision with Kenalog injection at the time of surgery.  And then a week later.  And likely a month later.  It would be great if he could have silicone sheets but I do not think that they will stay on his head.  But he will need to do massage and pressure.  For the keloids on his chest recommend no excision just Kenalog and silicone sheets.   Alena Bills Loralyn Rachel, DO

## 2018-08-18 ENCOUNTER — Ambulatory Visit (HOSPITAL_BASED_OUTPATIENT_CLINIC_OR_DEPARTMENT_OTHER): Admit: 2018-08-18 | Payer: BLUE CROSS/BLUE SHIELD | Admitting: Plastic Surgery

## 2018-08-18 ENCOUNTER — Encounter (HOSPITAL_BASED_OUTPATIENT_CLINIC_OR_DEPARTMENT_OTHER): Payer: Self-pay

## 2018-08-18 SURGERY — EXCISION LIPOMA
Anesthesia: Choice | Site: Scalp

## 2018-08-27 ENCOUNTER — Encounter: Payer: Self-pay | Admitting: Plastic Surgery

## 2018-10-12 ENCOUNTER — Encounter: Payer: Self-pay | Admitting: Plastic Surgery

## 2018-10-12 ENCOUNTER — Other Ambulatory Visit: Payer: Self-pay

## 2018-10-12 ENCOUNTER — Ambulatory Visit (INDEPENDENT_AMBULATORY_CARE_PROVIDER_SITE_OTHER): Payer: Self-pay | Admitting: Plastic Surgery

## 2018-10-12 VITALS — BP 143/98 | HR 90 | Temp 99.1°F | Ht 68.0 in | Wt 232.0 lb

## 2018-10-12 DIAGNOSIS — L910 Hypertrophic scar: Secondary | ICD-10-CM

## 2018-10-12 MED ORDER — CEPHALEXIN 500 MG PO CAPS: 500.0000 mg | ORAL_CAPSULE | Freq: Four times a day (QID) | ORAL | 0 refills | Status: AC

## 2018-10-12 MED ORDER — HYDROCODONE-ACETAMINOPHEN 5-325 MG PO TABS: 1.0000 | ORAL_TABLET | Freq: Two times a day (BID) | ORAL | 0 refills | Status: AC | PRN

## 2018-10-12 MED ORDER — ONDANSETRON HCL 4 MG PO TABS: 4.0000 mg | ORAL_TABLET | Freq: Three times a day (TID) | ORAL | 0 refills | Status: AC | PRN

## 2018-10-12 NOTE — Progress Notes (Signed)
     Patient ID: Benjamin Gomez, male    DOB: 05-Apr-1981, 38 y.o.   MRN: [id number]   Chief Complaint  Patient presents with  . Skin Problem    The patient is a 38 yrs old male here for a history and physical for excision of keloids on the back of his scalp.  They have been getting quite large.  He is concerned about the size of them.  He is otherwise in good health.  Unfortunately he just lost his job but he is going to work on [employer].  The lesions do not look infected and look like typical keloids.   Review of Systems  Constitutional: Negative.   HENT: Negative.   Eyes: Negative.   Respiratory: Negative.   Cardiovascular: Negative.   Gastrointestinal: Negative.   Genitourinary: Negative.   Musculoskeletal: Negative.   Skin: Negative.   Hematological: Negative.   Psychiatric/Behavioral: Negative.     Past Medical History:  Diagnosis Date  . Asthma   . Keloid skin disorder    nuchal region  . Speech impediment     History reviewed. No pertinent surgical history.    Current Outpatient Medications:  .  acetaminophen (TYLENOL) 325 MG tablet, Take 325 mg by mouth every 6 (six) hours as needed for moderate pain., Disp: , Rfl:  .  albuterol (PROVENTIL,VENTOLIN) 90 MCG/ACT inhaler, Inhale 2 puffs into the lungs every 6 (six) hours as needed. Shortness of breath and wheezing , Disp: , Rfl:  .  EPINEPHrine 0.3 mg/0.3 mL IJ SOAJ injection, Inject into the muscle., Disp: , Rfl:    Objective:   There were no vitals filed for this visit.  Physical Exam Vitals signs and nursing note reviewed.  Constitutional:      Appearance: Normal appearance.  HENT:     Head:   Cardiovascular:     Rate and Rhythm: Normal rate.  Pulmonary:     Effort: Pulmonary effort is normal. No respiratory distress.     Breath sounds: No wheezing.  Skin:    General: Skin is warm.     Capillary Refill: Capillary refill takes less than 2 seconds.  Neurological:     General: No focal deficit  present.     Mental Status: He is alert and oriented to person, place, and time.  Psychiatric:        Mood and Affect: Mood normal.        Behavior: Behavior normal.     Assessment & Plan:  Keloid skin disorder Plan for excision of keloids on the back of the scalp. Prescriptions sent into pharmacy. Patient just lost his job and is going to work on [employer] to be sure he has coverage.  The risks that can be encountered with and after excision of a skin lesion were discussed and include the following but not limited to these: bleeding, infection, delayed healing, anesthesia risks, skin sensation changes, injury to structures including nerves, blood vessels, and muscles which may be temporary or permanent, allergies to tape, suture materials and glues, blood products, topical preparations or injected agents, skin contour irregularities, skin discoloration and swelling, deep vein thrombosis, cardiac and pulmonary complications, pain, which may persist, persistent pain, recurrence of the lesion, poor healing of the incision, possible need for revisional surgery or staged procedures.    Alena Bills Jaclynn Laumann, DO

## 2018-10-18 ENCOUNTER — Other Ambulatory Visit (HOSPITAL_COMMUNITY): Payer: Medicaid Other

## 2018-10-29 ENCOUNTER — Encounter: Payer: Self-pay | Admitting: Plastic Surgery

## 2018-11-08 ENCOUNTER — Other Ambulatory Visit: Payer: Self-pay

## 2018-11-08 ENCOUNTER — Encounter (HOSPITAL_BASED_OUTPATIENT_CLINIC_OR_DEPARTMENT_OTHER): Payer: Self-pay | Admitting: *Deleted

## 2018-11-11 ENCOUNTER — Encounter: Payer: Worker's Compensation | Admitting: Plastic Surgery

## 2018-11-15 ENCOUNTER — Other Ambulatory Visit (HOSPITAL_COMMUNITY): Payer: Worker's Compensation

## 2018-11-17 ENCOUNTER — Ambulatory Visit (HOSPITAL_BASED_OUTPATIENT_CLINIC_OR_DEPARTMENT_OTHER): Admit: 2018-11-17 | Payer: Medicaid Other | Admitting: Plastic Surgery

## 2018-11-17 SURGERY — EXCISION LIPOMA
Anesthesia: Choice | Site: Scalp

## 2018-11-26 ENCOUNTER — Encounter: Payer: Worker's Compensation | Admitting: Plastic Surgery

## 2019-03-23 ENCOUNTER — Encounter: Payer: Self-pay | Admitting: Plastic Surgery

## 2019-03-23 ENCOUNTER — Ambulatory Visit (INDEPENDENT_AMBULATORY_CARE_PROVIDER_SITE_OTHER): Payer: BC Managed Care – PPO | Admitting: Plastic Surgery

## 2019-03-23 ENCOUNTER — Other Ambulatory Visit: Payer: Self-pay

## 2019-03-23 VITALS — BP 119/81 | HR 91 | Temp 97.7°F | Ht 68.0 in | Wt 222.4 lb

## 2019-03-23 DIAGNOSIS — L910 Hypertrophic scar: Secondary | ICD-10-CM | POA: Diagnosis not present

## 2019-03-23 NOTE — H&P (View-Only) (Signed)
Referring Provider No referring provider defined for this encounter.   CC:  Chief Complaint  Patient presents with  . Advice Only    for keloids on scalp and chest      Benjamin Gomez is an 38 y.o. male.  HPI: Patient is here to discuss multiple keloids.  He had keloids on that posterior aspect of the scalp for at least 10 years.  He said they came about with a haircut.  They hurt and itch.  He additionally has some smaller ones in the central chest and on his right chest and right shoulder.  These also occasionally hurt and itch.  He believes these are from shaving.  These have been injected with steroids before with some temporary relief.  Allergies  Allergen Reactions  . Shellfish Allergy Anaphylaxis, Itching and Swelling  . Naproxen Sodium     Upset stomach    Outpatient Encounter Medications as of 03/23/2019  Medication Sig  . acetaminophen (TYLENOL) 325 MG tablet Take 325 mg by mouth every 6 (six) hours as needed for moderate pain.  Marland Kitchen albuterol (PROVENTIL,VENTOLIN) 90 MCG/ACT inhaler Inhale 2 puffs into the lungs every 6 (six) hours as needed. Shortness of breath and wheezing   . EPINEPHrine 0.3 mg/0.3 mL IJ SOAJ injection Inject into the muscle.   No facility-administered encounter medications on file as of 03/23/2019.      Past Medical History:  Diagnosis Date  . Asthma   . Keloid skin disorder    nuchal region  . Speech impediment     No past surgical history on file.  Family History  Problem Relation Age of Onset  . Diabetes Mother   . Cancer Maternal Grandmother        breast cancer x 2    Social History   Social History Narrative  . Not on file     Review of Systems General: Denies fevers, chills, weight loss CV: Denies chest pain, shortness of breath, palpitations  Physical Exam Vitals with BMI 03/23/2019 11/08/2018 10/12/2018  Height 5\' 8"  5\' 8"  5\' 8"   Weight 222 lbs 6 oz 228 lbs 232 lbs  BMI 33.82 34.68 35.28  Systolic 119 - 143  Diastolic  81 - 98  Pulse 91 - 90    General:  No acute distress,  Alert and oriented, Non-Toxic, Normal speech and affect HEENT: Normocephalic atraumatic.  Extraocular is intact.  Cranial nerves grossly intact.  He has 3 pedunculated keloids on his posterior inferior scalp.  These are each about 3 cm in size. Chest: He has around a 3 cm transverse keloid in the central chest around a 3-1/2 cm keloid in the right lateral chest.  He also has around a 1 cm one on his superior posterior right shoulder.  These are all flatter than the ones on his scalp.  Assessment/Plan Patient has multiple keloids.  I offered him excision of the ones in the posterior scalp scalp given their size and the fact that they are pedunculated.  I explained that he may need a skin graft for coverage of this and so I be prepared to take a split-thickness skin graft in the event that I have a hard time closing the defects.  The ones on the chest and shoulder I recommended injections since they are not quite as bad size wise.  He is on board with this plan we will try to get it scheduled for him I did discuss the risk that include bleeding, infection, damage to surrounding  structures, keloid recurrence, need for additional procedures.  He is fully understanding.  [name] 03/23/2019, 4:43 PM

## 2019-03-23 NOTE — Progress Notes (Signed)
Referring Provider No referring provider defined for this encounter.   CC:  Chief Complaint  Patient presents with  . Advice Only    for keloids on scalp and chest      Benjamin Gomez is an 38 y.o. male.  HPI: Patient is here to discuss multiple keloids.  He had keloids on that posterior aspect of the scalp for at least 10 years.  He said they came about with a haircut.  They hurt and itch.  He additionally has some smaller ones in the central chest and on his right chest and right shoulder.  These also occasionally hurt and itch.  He believes these are from shaving.  These have been injected with steroids before with some temporary relief.  Allergies  Allergen Reactions  . Shellfish Allergy Anaphylaxis, Itching and Swelling  . Naproxen Sodium     Upset stomach    Outpatient Encounter Medications as of 03/23/2019  Medication Sig  . acetaminophen (TYLENOL) 325 MG tablet Take 325 mg by mouth every 6 (six) hours as needed for moderate pain.  Marland Kitchen albuterol (PROVENTIL,VENTOLIN) 90 MCG/ACT inhaler Inhale 2 puffs into the lungs every 6 (six) hours as needed. Shortness of breath and wheezing   . EPINEPHrine 0.3 mg/0.3 mL IJ SOAJ injection Inject into the muscle.   No facility-administered encounter medications on file as of 03/23/2019.      Past Medical History:  Diagnosis Date  . Asthma   . Keloid skin disorder    nuchal region  . Speech impediment     No past surgical history on file.  Family History  Problem Relation Age of Onset  . Diabetes Mother   . Cancer Maternal Grandmother        breast cancer x 2    Social History   Social History Narrative  . Not on file     Review of Systems General: Denies fevers, chills, weight loss CV: Denies chest pain, shortness of breath, palpitations  Physical Exam Vitals with BMI 03/23/2019 11/08/2018 10/12/2018  Height 5\' 8"  5\' 8"  5\' 8"   Weight 222 lbs 6 oz 228 lbs 232 lbs  BMI 33.82 34.68 35.28  Systolic 119 - 143  Diastolic  81 - 98  Pulse 91 - 90    General:  No acute distress,  Alert and oriented, Non-Toxic, Normal speech and affect HEENT: Normocephalic atraumatic.  Extraocular is intact.  Cranial nerves grossly intact.  He has 3 pedunculated keloids on his posterior inferior scalp.  These are each about 3 cm in size. Chest: He has around a 3 cm transverse keloid in the central chest around a 3-1/2 cm keloid in the right lateral chest.  He also has around a 1 cm one on his superior posterior right shoulder.  These are all flatter than the ones on his scalp.  Assessment/Plan Patient has multiple keloids.  I offered him excision of the ones in the posterior scalp scalp given their size and the fact that they are pedunculated.  I explained that he may need a skin graft for coverage of this and so I be prepared to take a split-thickness skin graft in the event that I have a hard time closing the defects.  The ones on the chest and shoulder I recommended injections since they are not quite as bad size wise.  He is on board with this plan we will try to get it scheduled for him I did discuss the risk that include bleeding, infection, damage to surrounding  structures, keloid recurrence, need for additional procedures.  He is fully understanding.  [name] 03/23/2019, 4:43 PM

## 2019-03-29 ENCOUNTER — Other Ambulatory Visit: Payer: Self-pay

## 2019-03-29 DIAGNOSIS — Z20822 Contact with and (suspected) exposure to covid-19: Secondary | ICD-10-CM

## 2019-03-31 LAB — NOVEL CORONAVIRUS, NAA: SARS-CoV-2, NAA: NOT DETECTED

## 2019-04-05 ENCOUNTER — Other Ambulatory Visit: Payer: Self-pay

## 2019-04-05 ENCOUNTER — Encounter (HOSPITAL_BASED_OUTPATIENT_CLINIC_OR_DEPARTMENT_OTHER): Payer: Self-pay

## 2019-04-06 NOTE — Progress Notes (Cosign Needed)
C.C.: pre-operative examination   History of Present Illness: Benjamin Gomez is a 38 y.o.  male who presents for preoperative evaluation for upcoming procedure, excision of of posterior neck/scalp keloids with complex closure, scheduled for 04/15/19, with Dr. Claudia Desanctis. The keloids have been present since 2005. Patient believes they originally developed after wearing a "dirty football helmet." Patient noticed they got larger after scratching and plucking hair from the area. Patient also has smaller keloids on his right upper chest and mid upper chest. These will be treated with kenalog injections. Patient was previously scheduled for surgery in June 2020, but was delayed due to loss of insurance. Post-op prescription were prescribed, but not filled per patient.   Allergy to shellfish, causing anaphylaxis. Past medical history is significant for asthma and speech impediment. Patient states he use albuterol inhaler about 2x/month. No recent asthma exacerbations or ED visits. No previous surgeries. Patient has never received anesthesia. No family history of adverse reactions to anesthesia. Patient denies any history of stroke or MI. Patient denies any personal or family history of blood clots. Patient does not take any anticoagulants. Patient denies any tobacco use.    Past Medical History: Allergies: Allergies  Allergen Reactions  . Shellfish Allergy Anaphylaxis, Itching and Swelling  . Naproxen Sodium     Upset stomach    Current Medications:  Current Outpatient Medications:  .  acetaminophen (TYLENOL) 325 MG tablet, Take 325 mg by mouth every 6 (six) hours as needed for moderate pain., Disp: , Rfl:  .  albuterol (PROVENTIL,VENTOLIN) 90 MCG/ACT inhaler, Inhale 2 puffs into the lungs every 6 (six) hours as needed. Shortness of breath and wheezing , Disp: , Rfl:  .  cephALEXin (KEFLEX) 500 MG capsule, Take 1 capsule (500 mg total) by mouth 4 (four) times daily for 5 days., Disp: 20 capsule, Rfl: 0 .   EPINEPHrine 0.3 mg/0.3 mL IJ SOAJ injection, Inject into the muscle., Disp: , Rfl:  .  HYDROcodone-acetaminophen (NORCO) 10-325 MG tablet, Take 1 tablet by mouth every 8 (eight) hours as needed for up to 3 days., Disp: 9 tablet, Rfl: 0 .  ondansetron (ZOFRAN) 4 MG tablet, Take 1 tablet (4 mg total) by mouth every 8 (eight) hours as needed for nausea or vomiting., Disp: 10 tablet, Rfl: 0  Past Medical Problems: Past Medical History:  Diagnosis Date  . Asthma   . Keloid skin disorder    nuchal region  . Speech impediment     Past Surgical History: History reviewed. No pertinent surgical history.  The patient has NOT had anesthesia or sedation in the past.   The patient does NOT have a family history of anesthesia problems.    Social History: Social History   Socioeconomic History  . Marital status: Legally Separated    Spouse name: n/a  . Number of children: 1  . Years of education: 12+  . Highest education level: Not on file  Occupational History  . Occupation: Service Department    Employer: [employer]    Comment: [employer]  Social Needs  . Financial resource strain: Not on file  . Food insecurity    Worry: Not on file    Inability: Not on file  . Transportation needs    Medical: Not on file    Non-medical: Not on file  Tobacco Use  . Smoking status: Never Smoker  . Smokeless tobacco: Never Used  Substance and Sexual Activity  . Alcohol use: Yes    Alcohol/week: 2.0 standard drinks  Types: 2 Cans of beer per week  . Drug use: No  . Sexual activity: Yes    Partners: Female  Lifestyle  . Physical activity    Days per week: Not on file    Minutes per session: Not on file  . Stress: Not on file  Relationships  . Social Musician on phone: Not on file    Gets together: Not on file    Attends religious service: Not on file    Active member of club or organization: Not on file    Attends meetings of clubs or organizations: Not on file     Relationship status: Not on file  . Intimate partner violence    Fear of current or ex partner: Not on file    Emotionally abused: Not on file    Physically abused: Not on file    Forced sexual activity: Not on file  Other Topics Concern  . Not on file  Social History Narrative  . Not on file    Family History: Family History  Problem Relation Age of Onset  . Diabetes Mother   . Cancer Maternal Grandmother        breast cancer x 2    Review of Systems: General ROS: negative Dermatological ROS: keloids Cardiovascular ROS: no chest pain or dyspnea on exertion ENT ROS: negative Gastrointestinal ROS: no abdominal pain, change in bowel habits, or black or bloody stools  Physical Exam: Vital Signs BP 131/90 (BP Location: Right Arm, Patient Position: Sitting, Cuff Size: Large)   Pulse 87   Ht 5\' 8"  (1.727 m)   Wt 215 lb (97.5 kg)   SpO2 97%   BMI 32.69 kg/m  General: awake, alert, appears stated age, no acute distress HEENT: 3 pedunculated keloids on posterior inferior scalp Neck: supple, full ROM Chest: symmetrical rise and fall Cardiac: regular rate and rhythm, +2 radial pulses bilaterally Lungs: CTA throughout, no wheezes, rhonchi, or rales Abdomen: soft, non-distended, non-tender Musculoskeletal: MAE x4 Neuro: A&Ox3 Skin: keloid on right upper chest and mid upper chest  Assessment: 38 y.o.male with 3 large pedunculated keloids on his posterior neck/scalp. He also has two smaller keloids on his chest. Patient's asthma that is controlled with medications. No recent exacerbations.   Caprini score=2, will only require mechanical prophylaxis during surgery.   Plan: Patient is scheduled for excision of of posterior neck/scalp keloids with complex closure, on12/4/20, with Dr. 14/4/20. Patient may need a split-thickness skin graft if unable to close the defects. Post-op medications prescribed.   Risks, benefits, and alternatives of procedure discussed, expected recovery, and  questions answered. Risks include bleeding, infection, damage to surrounding structures, keloid recurrence, and need for additional procedures.     COVID-19 pre-procedure test scheduled for 04/12/19. Post-op visit scheduled for 04/21/19.   Electronically signed by: 14/10/20, NP 04/11/2019 9:54 AM

## 2019-04-11 ENCOUNTER — Ambulatory Visit (INDEPENDENT_AMBULATORY_CARE_PROVIDER_SITE_OTHER): Payer: BC Managed Care – PPO | Admitting: Nurse Practitioner

## 2019-04-11 ENCOUNTER — Other Ambulatory Visit: Payer: Self-pay

## 2019-04-11 ENCOUNTER — Encounter: Payer: Self-pay | Admitting: Nurse Practitioner

## 2019-04-11 VITALS — BP 131/90 | HR 87 | Ht 68.0 in | Wt 215.0 lb

## 2019-04-11 DIAGNOSIS — L910 Hypertrophic scar: Secondary | ICD-10-CM

## 2019-04-11 MED ORDER — CEPHALEXIN 500 MG PO CAPS: 500.0000 mg | ORAL_CAPSULE | Freq: Four times a day (QID) | ORAL | 0 refills | Status: AC

## 2019-04-11 MED ORDER — ONDANSETRON HCL 4 MG PO TABS: 4.0000 mg | ORAL_TABLET | Freq: Three times a day (TID) | ORAL | 0 refills | Status: DC | PRN

## 2019-04-11 MED ORDER — HYDROCODONE-ACETAMINOPHEN 10-325 MG PO TABS: 1.0000 | ORAL_TABLET | Freq: Three times a day (TID) | ORAL | 0 refills | Status: AC | PRN

## 2019-04-12 ENCOUNTER — Other Ambulatory Visit (HOSPITAL_COMMUNITY)
Admission: RE | Admit: 2019-04-12 | Discharge: 2019-04-12 | Disposition: A | Discharge status: Home / Self Care | Payer: BC Managed Care – PPO | Source: Ambulatory Visit | Attending: Plastic Surgery | Admitting: Plastic Surgery

## 2019-04-12 DIAGNOSIS — Z01812 Encounter for preprocedural laboratory examination: Secondary | ICD-10-CM | POA: Diagnosis not present

## 2019-04-12 DIAGNOSIS — Z20828 Contact with and (suspected) exposure to other viral communicable diseases: Secondary | ICD-10-CM | POA: Diagnosis not present

## 2019-04-14 LAB — NOVEL CORONAVIRUS, NAA (HOSP ORDER, SEND-OUT TO REF LAB; TAT 18-24 HRS): SARS-CoV-2, NAA: NOT DETECTED

## 2019-04-15 ENCOUNTER — Encounter (HOSPITAL_BASED_OUTPATIENT_CLINIC_OR_DEPARTMENT_OTHER): Payer: Self-pay

## 2019-04-15 ENCOUNTER — Ambulatory Visit (HOSPITAL_BASED_OUTPATIENT_CLINIC_OR_DEPARTMENT_OTHER): Payer: BC Managed Care – PPO | Admitting: Certified Registered"

## 2019-04-15 ENCOUNTER — Ambulatory Visit (HOSPITAL_BASED_OUTPATIENT_CLINIC_OR_DEPARTMENT_OTHER)
Admission: RE | Admit: 2019-04-15 | Discharge: 2019-04-15 | Disposition: A | Discharge status: Home / Self Care | Payer: BC Managed Care – PPO | Attending: Plastic Surgery | Admitting: Plastic Surgery

## 2019-04-15 ENCOUNTER — Other Ambulatory Visit: Payer: Self-pay

## 2019-04-15 ENCOUNTER — Encounter (HOSPITAL_BASED_OUTPATIENT_CLINIC_OR_DEPARTMENT_OTHER)
Admission: RE | Disposition: A | Discharge status: Home / Self Care | Payer: Self-pay | Source: Home / Self Care | Attending: Plastic Surgery

## 2019-04-15 DIAGNOSIS — J45909 Unspecified asthma, uncomplicated: Secondary | ICD-10-CM | POA: Diagnosis not present

## 2019-04-15 DIAGNOSIS — L910 Hypertrophic scar: Secondary | ICD-10-CM | POA: Insufficient documentation

## 2019-04-15 HISTORY — PX: LIPOMA EXCISION: SHX5283

## 2019-04-15 HISTORY — PX: SKIN SPLIT GRAFT: SHX444

## 2019-04-15 SURGERY — EXCISION LIPOMA
Anesthesia: General | Site: Scalp | Laterality: Right

## 2019-04-15 MED ORDER — FENTANYL CITRATE (PF) 100 MCG/2ML IJ SOLN: 50.0000 ug | INTRAMUSCULAR | Status: DC | PRN

## 2019-04-15 MED ORDER — DEXAMETHASONE SODIUM PHOSPHATE 10 MG/ML IJ SOLN
INTRAMUSCULAR | Status: AC
  Filled 2019-04-15: qty 1

## 2019-04-15 MED ORDER — OXYCODONE HCL 5 MG PO TABS: 5.0000 mg | ORAL_TABLET | Freq: Once | ORAL | Status: DC | PRN

## 2019-04-15 MED ORDER — MIDAZOLAM HCL 5 MG/5ML IJ SOLN
INTRAMUSCULAR | Status: DC | PRN
  Administered 2019-04-15: 2 mg via INTRAVENOUS

## 2019-04-15 MED ORDER — BUPIVACAINE-EPINEPHRINE (PF) 0.5% -1:200000 IJ SOLN
INTRAMUSCULAR | Status: DC | PRN
  Administered 2019-04-15: 5 mL

## 2019-04-15 MED ORDER — OXYCODONE HCL 5 MG/5ML PO SOLN: 5.0000 mg | Freq: Once | ORAL | Status: DC | PRN

## 2019-04-15 MED ORDER — SUCCINYLCHOLINE CHLORIDE 200 MG/10ML IV SOSY
PREFILLED_SYRINGE | INTRAVENOUS | Status: DC | PRN
  Administered 2019-04-15: 140 mg via INTRAVENOUS

## 2019-04-15 MED ORDER — MEPERIDINE HCL 25 MG/ML IJ SOLN: 6.2500 mg | INTRAMUSCULAR | Status: DC | PRN

## 2019-04-15 MED ORDER — LACTATED RINGERS IV SOLN
INTRAVENOUS | Status: DC
  Administered 2019-04-15: 12:00:00 via INTRAVENOUS

## 2019-04-15 MED ORDER — FENTANYL CITRATE (PF) 100 MCG/2ML IJ SOLN
INTRAMUSCULAR | Status: DC | PRN
  Administered 2019-04-15: 100 ug via INTRAVENOUS

## 2019-04-15 MED ORDER — FENTANYL CITRATE (PF) 100 MCG/2ML IJ SOLN
INTRAMUSCULAR | Status: AC
  Filled 2019-04-15: qty 2

## 2019-04-15 MED ORDER — PROPOFOL 10 MG/ML IV BOLUS
INTRAVENOUS | Status: DC | PRN
  Administered 2019-04-15: 200 mg via INTRAVENOUS

## 2019-04-15 MED ORDER — BUPIVACAINE-EPINEPHRINE 0.25% -1:200000 IJ SOLN: INTRAMUSCULAR | Status: DC | PRN

## 2019-04-15 MED ORDER — LIDOCAINE-EPINEPHRINE 1 %-1:100000 IJ SOLN
INTRAMUSCULAR | Status: DC | PRN
  Administered 2019-04-15: 20 mL via INTRADERMAL

## 2019-04-15 MED ORDER — LIDOCAINE-EPINEPHRINE 1 %-1:100000 IJ SOLN
INTRAMUSCULAR | Status: AC
  Filled 2019-04-15: qty 1

## 2019-04-15 MED ORDER — TRIAMCINOLONE ACETONIDE 40 MG/ML IJ SUSP
INTRAMUSCULAR | Status: DC | PRN
  Administered 2019-04-15: 40 mg

## 2019-04-15 MED ORDER — CEFAZOLIN SODIUM-DEXTROSE 2-4 GM/100ML-% IV SOLN
2.0000 g | INTRAVENOUS | Status: AC
  Administered 2019-04-15: 2 g via INTRAVENOUS

## 2019-04-15 MED ORDER — MIDAZOLAM HCL 2 MG/2ML IJ SOLN: 1.0000 mg | INTRAMUSCULAR | Status: DC | PRN

## 2019-04-15 MED ORDER — MIDAZOLAM HCL 2 MG/2ML IJ SOLN
INTRAMUSCULAR | Status: AC
  Filled 2019-04-15: qty 2

## 2019-04-15 MED ORDER — LIDOCAINE HCL (CARDIAC) PF 100 MG/5ML IV SOSY
PREFILLED_SYRINGE | INTRAVENOUS | Status: DC | PRN
  Administered 2019-04-15: 100 mg via INTRAVENOUS

## 2019-04-15 MED ORDER — CEFAZOLIN SODIUM-DEXTROSE 2-4 GM/100ML-% IV SOLN
INTRAVENOUS | Status: AC
  Filled 2019-04-15: qty 100

## 2019-04-15 MED ORDER — HYDROMORPHONE HCL 1 MG/ML IJ SOLN: 0.2500 mg | INTRAMUSCULAR | Status: DC | PRN

## 2019-04-15 MED ORDER — PROPOFOL 10 MG/ML IV BOLUS
INTRAVENOUS | Status: AC
  Filled 2019-04-15: qty 20

## 2019-04-15 MED ORDER — ONDANSETRON HCL 4 MG/2ML IJ SOLN
INTRAMUSCULAR | Status: DC | PRN
  Administered 2019-04-15: 4 mg via INTRAVENOUS

## 2019-04-15 MED ORDER — DEXAMETHASONE SODIUM PHOSPHATE 10 MG/ML IJ SOLN
INTRAMUSCULAR | Status: DC | PRN
  Administered 2019-04-15: 10 mg via INTRAVENOUS

## 2019-04-15 MED ORDER — ONDANSETRON HCL 4 MG/2ML IJ SOLN
INTRAMUSCULAR | Status: AC
  Filled 2019-04-15: qty 2

## 2019-04-15 MED ORDER — PROMETHAZINE HCL 25 MG/ML IJ SOLN: 6.2500 mg | INTRAMUSCULAR | Status: DC | PRN

## 2019-04-15 SURGICAL SUPPLY — 68 items
BAG DECANTER FOR FLEXI CONT (MISCELLANEOUS) IMPLANT
BENZOIN TINCTURE PRP APPL 2/3 (GAUZE/BANDAGES/DRESSINGS) IMPLANT
BLADE CLIPPER SURG (BLADE) IMPLANT
BLADE DERMATOME SS (BLADE) IMPLANT
BLADE HEX COATED 2.75 (ELECTRODE) IMPLANT
BLADE SURG 10 STRL SS (BLADE) IMPLANT
BLADE SURG 15 STRL LF DISP TIS (BLADE) ×2 IMPLANT
BLADE SURG 15 STRL SS (BLADE) ×4
BNDG COHESIVE 4X5 TAN STRL (GAUZE/BANDAGES/DRESSINGS) IMPLANT
BNDG ELASTIC 4X5.8 VLCR STR LF (GAUZE/BANDAGES/DRESSINGS) IMPLANT
CANISTER SUCT 1200ML W/VALVE (MISCELLANEOUS) IMPLANT
CLOSURE WOUND 1/2 X4 (GAUZE/BANDAGES/DRESSINGS)
COTTONBALL LRG STERILE PKG (GAUZE/BANDAGES/DRESSINGS) ×8 IMPLANT
COVER BACK TABLE REUSABLE LG (DRAPES) ×4 IMPLANT
COVER MAYO STAND REUSABLE (DRAPES) ×4 IMPLANT
COVER WAND RF STERILE (DRAPES) IMPLANT
DECANTER SPIKE VIAL GLASS SM (MISCELLANEOUS) IMPLANT
DERMABOND ADVANCED (GAUZE/BANDAGES/DRESSINGS)
DERMABOND ADVANCED .7 DNX12 (GAUZE/BANDAGES/DRESSINGS) IMPLANT
DERMACARRIERS GRAFT 1 TO 1.5 (DISPOSABLE)
DRAPE HALF SHEET 70X43 (DRAPES) IMPLANT
DRAPE LAPAROTOMY 100X72 PEDS (DRAPES) IMPLANT
DRAPE U-SHAPE 76X120 STRL (DRAPES) IMPLANT
DRSG MEPILEX BORDER 4X8 (GAUZE/BANDAGES/DRESSINGS) ×4 IMPLANT
DRSG PAD ABDOMINAL 8X10 ST (GAUZE/BANDAGES/DRESSINGS) IMPLANT
DRSG TEGADERM 4X10 (GAUZE/BANDAGES/DRESSINGS) IMPLANT
ELECT REM PT RETURN 9FT ADLT (ELECTROSURGICAL)
ELECTRODE REM PT RTRN 9FT ADLT (ELECTROSURGICAL) IMPLANT
GAUZE 4X4 16PLY RFD (DISPOSABLE) IMPLANT
GAUZE SPONGE 4X4 12PLY STRL (GAUZE/BANDAGES/DRESSINGS) ×8 IMPLANT
GAUZE XEROFORM 5X9 LF (GAUZE/BANDAGES/DRESSINGS) ×4 IMPLANT
GLOVE BIO SURGEON STRL SZ7.5 (GLOVE) ×4 IMPLANT
GLOVE BIOGEL M STRL SZ7.5 (GLOVE) ×4 IMPLANT
GLOVE BIOGEL PI IND STRL 6.5 (GLOVE) ×2 IMPLANT
GLOVE BIOGEL PI IND STRL 7.0 (GLOVE) ×2 IMPLANT
GLOVE BIOGEL PI IND STRL 8 (GLOVE) ×2 IMPLANT
GLOVE BIOGEL PI INDICATOR 6.5 (GLOVE) ×2
GLOVE BIOGEL PI INDICATOR 7.0 (GLOVE) ×2
GLOVE BIOGEL PI INDICATOR 8 (GLOVE) ×2
GLOVE ECLIPSE 6.5 STRL STRAW (GLOVE) ×4 IMPLANT
GOWN STRL REUS W/ TWL LRG LVL3 (GOWN DISPOSABLE) ×2 IMPLANT
GOWN STRL REUS W/TWL LRG LVL3 (GOWN DISPOSABLE) ×4
GRAFT DERMACARRIERS 1 TO 1.5 (DISPOSABLE) IMPLANT
NEEDLE PRECISIONGLIDE 27X1.5 (NEEDLE) ×4 IMPLANT
NS IRRIG 1000ML POUR BTL (IV SOLUTION) ×4 IMPLANT
PACK BASIN DAY SURGERY FS (CUSTOM PROCEDURE TRAY) ×4 IMPLANT
PAD CAST 4YDX4 CTTN HI CHSV (CAST SUPPLIES) IMPLANT
PADDING CAST COTTON 4X4 STRL (CAST SUPPLIES)
PENCIL SMOKE EVACUATOR (MISCELLANEOUS) IMPLANT
STRIP CLOSURE SKIN 1/2X4 (GAUZE/BANDAGES/DRESSINGS) IMPLANT
SUCTION FRAZIER HANDLE 10FR (MISCELLANEOUS)
SUCTION TUBE FRAZIER 10FR DISP (MISCELLANEOUS) IMPLANT
SUT CHROMIC 5 0 P 3 (SUTURE) IMPLANT
SUT ETHILON 4 0 P 3 18 (SUTURE) IMPLANT
SUT MON AB 4-0 PC3 18 (SUTURE) IMPLANT
SUT MON AB 5-0 PS2 18 (SUTURE) IMPLANT
SUT PROLENE 3 0 PS 2 (SUTURE) ×8 IMPLANT
SUT PROLENE 4 0 P 3 18 (SUTURE) IMPLANT
SUT VIC AB 3-0 FS2 27 (SUTURE) IMPLANT
SUT VICRYL 4-0 PS2 18IN ABS (SUTURE) IMPLANT
SYR BULB 3OZ (MISCELLANEOUS) ×4 IMPLANT
SYR CONTROL 10ML LL (SYRINGE) ×4 IMPLANT
TOWEL GREEN STERILE FF (TOWEL DISPOSABLE) ×4 IMPLANT
TRAY DSU PREP LF (CUSTOM PROCEDURE TRAY) ×4 IMPLANT
TUBE CONNECTING 20'X1/4 (TUBING)
TUBE CONNECTING 20X1/4 (TUBING) IMPLANT
UNDERPAD 30X36 HEAVY ABSORB (UNDERPADS AND DIAPERS) IMPLANT
YANKAUER SUCT BULB TIP NO VENT (SUCTIONS) IMPLANT

## 2019-04-15 NOTE — Anesthesia Postprocedure Evaluation (Signed)
Anesthesia Post Note  Patient: LEELYND MALDONADO  Procedure(s) Performed: Excision of posterior neck/scalp keloids with complex closure (N/A Scalp) keloid injection to anterior chest/shoulder (Right Chest)     Patient location during evaluation: PACU Anesthesia Type: General Level of consciousness: awake and alert Pain management: pain level controlled Vital Signs Assessment: post-procedure vital signs reviewed and stable Respiratory status: spontaneous breathing, nonlabored ventilation and respiratory function stable Cardiovascular status: blood pressure returned to baseline and stable Postop Assessment: no apparent nausea or vomiting Anesthetic complications: no    Last Vitals:  Vitals:   04/15/19 1500 04/15/19 1510  BP: 122/77 121/84  Pulse:  80  Resp: 13 16  Temp:  36.4 C  SpO2:  94%    Last Pain:  Vitals:   04/15/19 1510  TempSrc:   PainSc: 0-No pain                 Lynda Rainwater

## 2019-04-15 NOTE — Interval H&P Note (Signed)
History and Physical Interval Note:  04/15/2019 12:52 PM  Benjamin Gomez  has presented today for surgery, with the diagnosis of Keloids on neck and scalp.  The various methods of treatment have been discussed with the patient and family. After consideration of risks, benefits and other options for treatment, the patient has consented to  Procedure(s): Excision of posterior neck/scalp keloids with complex closure (N/A) possible STSG AND keloid injection to anterior chest/shoulder (Right) as a surgical intervention.  The patient's history has been reviewed, patient examined, no change in status, stable for surgery.  I have reviewed the patient's chart and labs.  Questions were answered to the patient's satisfaction.     [name]

## 2019-04-15 NOTE — Anesthesia Preprocedure Evaluation (Signed)
Anesthesia Evaluation  Patient identified by MRN, date of birth, ID band Patient awake    Reviewed: Allergy & Precautions, NPO status , Patient's Chart, lab work & pertinent test results  Airway Mallampati: II  TM Distance: >3 FB Neck ROM: Full    Dental no notable dental hx.    Pulmonary asthma ,    Pulmonary exam normal breath sounds clear to auscultation       Cardiovascular negative cardio ROS Normal cardiovascular exam Rhythm:Regular Rate:Normal     Neuro/Psych negative neurological ROS  negative psych ROS   GI/Hepatic negative GI ROS, Neg liver ROS,   Endo/Other  negative endocrine ROS  Renal/GU negative Renal ROS  negative genitourinary   Musculoskeletal negative musculoskeletal ROS (+)   Abdominal (+) + obese,   Peds negative pediatric ROS (+)  Hematology negative hematology ROS (+)   Anesthesia Other Findings   Reproductive/Obstetrics negative OB ROS                             Anesthesia Physical Anesthesia Plan  ASA: II  Anesthesia Plan: General   Post-op Pain Management:    Induction: Intravenous  PONV Risk Score and Plan: 2 and Ondansetron, Midazolam and Treatment may vary due to age or medical condition  Airway Management Planned: Oral ETT  Additional Equipment:   Intra-op Plan:   Post-operative Plan: Extubation in OR  Informed Consent: I have reviewed the patients History and Physical, chart, labs and discussed the procedure including the risks, benefits and alternatives for the proposed anesthesia with the patient or authorized representative who has indicated his/her understanding and acceptance.     Dental advisory given  Plan Discussed with: CRNA  Anesthesia Plan Comments:         Anesthesia Quick Evaluation

## 2019-04-15 NOTE — Anesthesia Procedure Notes (Signed)
Procedure Name: Intubation Date/Time: 04/15/2019 1:17 PM Performed by: [place] Toyia Jelinek C, CRNA Pre-anesthesia Checklist: Patient identified, Emergency Drugs available, Suction available and Patient being monitored Patient Re-evaluated:Patient Re-evaluated prior to induction Oxygen Delivery Method: Circle system utilized Preoxygenation: Pre-oxygenation with 100% oxygen Induction Type: IV induction Ventilation: Mask ventilation without difficulty Laryngoscope Size: Mac and 3 Grade View: Grade I Tube type: Oral Tube size: 7.0 mm Number of attempts: 1 Airway Equipment and Method: Stylet and Oral airway Placement Confirmation: ETT inserted through vocal cords under direct vision,  positive ETCO2 and breath sounds checked- equal and bilateral Secured at: 22 cm Tube secured with: Tape Dental Injury: Teeth and Oropharynx as per pre-operative assessment

## 2019-04-15 NOTE — Op Note (Signed)
Operative Note   DATE OF OPERATION: 04/15/2019  LOCATION: [facility]   SURGICAL DEPARTMENT: Plastic Surgery  PREOPERATIVE DIAGNOSES: Keloids posterior neck, central chest, right chest, and right shoulder.  POSTOPERATIVE DIAGNOSES:  same  PROCEDURE:  1. Excision of posterior neck keloids x3 measuring a total of 15 cm 2. Complex closure measuring 15 cm 3.   Steroid injection to keloids of central chest, right chest and right shoulder  SURGEON: Talmadge Coventry, MD  ANESTHESIA: General  COMPLICATIONS: None.   INDICATIONS FOR PROCEDURE:  The patient, Benjamin Gomez is a 38 y.o. male born on 07/22/1980, is here for treatment of multiple keloids.  The ones on the neck are pedunculated and quite a bit bigger and would benefit from excision.  The others are smaller and flatter but are still symptomatic and would be best managed with injection at this point. MRN: [id number]  CONSENT:  Informed consent was obtained directly from the patient. Risks, benefits and alternatives were fully discussed. Specific risks including but not limited to bleeding, infection, hematoma, seroma, scarring, pain, infection, wound healing problems, and need for further surgery were all discussed. The patient did have an ample opportunity to have questions answered to satisfaction.   DESCRIPTION OF PROCEDURE:  Patient was taken to the operating room.  General anesthesia was administered.  Patient was flipped prone.  Antibiotics have been given.  The patient's operative site was prepped and draped in a sterile fashion. A time out was performed and all information was confirmed to be correct.  The 3 lesions in the posterior neck were excised with a 15 blade.  Hemostasis was obtained.  Circumferential undermining was performed and the skin was advanced and closed in layers with interrupted buried Monocryl sutures and running 3-0 Prolene for the skin.  The total size of the 3 lesions excised measured 15 cm,  and the total length of closure measured 15 cm.  Marcaine was injected around the wound prior to covering with a Mepilex dressing.  The remaining 3 keloids were injected with a mixture of lidocaine with epinephrine and Kenalog 40.  Approximately 2 cc of this mixture were injected into each lesion.  The patient tolerated the procedure well.  There were no complications.

## 2019-04-15 NOTE — Brief Op Note (Signed)
04/15/2019  2:15 PM  PATIENT:  Benjamin Gomez  38 y.o. male  PRE-OPERATIVE DIAGNOSIS:  Keloids on neck and scalp  POST-OPERATIVE DIAGNOSIS:  Keloids on neck and scalp  PROCEDURE:  Procedure(s): Excision of posterior neck/scalp keloids with complex closure (N/A) keloid injection to anterior chest/shoulder (Right)  SURGEON:  Surgeon(s) and Role:    * Roger Kettles, Steffanie Dunn, MD - Primary  PHYSICIAN ASSISTANT: Software engineer, PA  ASSISTANTS: none   ANESTHESIA:   general  EBL:  Minimal  BLOOD ADMINISTERED:none  DRAINS: none   LOCAL MEDICATIONS USED:  BUPIVICAINE   SPECIMEN:  Source of Specimen:  posterior neck keloids  DISPOSITION OF SPECIMEN:  PATHOLOGY  COUNTS:  YES  TOURNIQUET:  * No tourniquets in log *  DICTATION: .Dragon Dictation  PLAN OF CARE: Discharge to home after PACU  PATIENT DISPOSITION:  PACU - hemodynamically stable.   Delay start of Pharmacological VTE agent (>24hrs) due to surgical blood loss or risk of bleeding: not applicable

## 2019-04-15 NOTE — Discharge Instructions (Signed)
Activity As tolerated No driving while on pain medication No heavy activities  Diet: Regular  Wound Care: Keep dressing clean & dry.  Can remove dressing tomorrow and shower regularly.  Would recommend covering wound with gauze dressing at night to keep any drainage off your pillow.  Call Doctor if any unusual problems occur such as pain, excessive Bleeding, unrelieved Nausea/vomiting, Fever &/or chills Follow-up appointment: Scheduled for next week.  Post Anesthesia Home Care Instructions  Activity: Get plenty of rest for the remainder of the day. A responsible individual must stay with you for 24 hours following the procedure.  For the next 24 hours, DO NOT: -Drive a car -Paediatric nurse -Drink alcoholic beverages -Take any medication unless instructed by your physician -Make any legal decisions or sign important papers.  Meals: Start with liquid foods such as gelatin or soup. Progress to regular foods as tolerated. Avoid greasy, spicy, heavy foods. If nausea and/or vomiting occur, drink only clear liquids until the nausea and/or vomiting subsides. Call your physician if vomiting continues.  Special Instructions/Symptoms: Your throat may feel dry or sore from the anesthesia or the breathing tube placed in your throat during surgery. If this causes discomfort, gargle with warm salt water. The discomfort should disappear within 24 hours.  If you had a scopolamine patch placed behind your ear for the management of post- operative nausea and/or vomiting:  1. The medication in the patch is effective for 72 hours, after which it should be removed.  Wrap patch in a tissue and discard in the trash. Wash hands thoroughly with soap and water. 2. You may remove the patch earlier than 72 hours if you experience unpleasant side effects which may include dry mouth, dizziness or visual disturbances. 3. Avoid touching the patch. Wash your hands with soap and water after contact with the  patch.

## 2019-04-15 NOTE — Transfer of Care (Signed)
Immediate Anesthesia Transfer of Care Note  Patient: Benjamin Gomez  Procedure(s) Performed: Excision of posterior neck/scalp keloids with complex closure (N/A Scalp) keloid injection to anterior chest/shoulder (Right Chest)  Patient Location: PACU  Anesthesia Type:General  Level of Consciousness: awake and drowsy  Airway & Oxygen Therapy: Patient Spontanous Breathing and Patient connected to face mask oxygen  Post-op Assessment: Report given to RN and Post -op Vital signs reviewed and stable  Post vital signs: Reviewed and stable  Last Vitals:  Vitals Value Taken Time  BP 120/93 04/15/19 1436  Temp    Pulse 116 04/15/19 1437  Resp 18 04/15/19 1437  SpO2 96 % 04/15/19 1437  Vitals shown include unvalidated device data.  Last Pain:  Vitals:   04/15/19 1142  TempSrc: Temporal  PainSc: 0-No pain         Complications: No apparent anesthesia complications

## 2019-04-18 ENCOUNTER — Encounter (HOSPITAL_BASED_OUTPATIENT_CLINIC_OR_DEPARTMENT_OTHER): Payer: Self-pay | Admitting: Plastic Surgery

## 2019-04-18 LAB — SURGICAL PATHOLOGY

## 2019-04-21 ENCOUNTER — Encounter: Payer: Worker's Compensation | Admitting: Plastic Surgery

## 2019-04-28 ENCOUNTER — Ambulatory Visit (INDEPENDENT_AMBULATORY_CARE_PROVIDER_SITE_OTHER): Payer: BC Managed Care – PPO | Admitting: Surgical

## 2019-04-28 ENCOUNTER — Encounter: Payer: Self-pay | Admitting: Surgical

## 2019-04-28 ENCOUNTER — Other Ambulatory Visit: Payer: Self-pay

## 2019-04-28 VITALS — BP 130/84 | HR 80 | Temp 98.6°F | Ht 68.0 in | Wt 218.8 lb

## 2019-04-28 DIAGNOSIS — L910 Hypertrophic scar: Secondary | ICD-10-CM

## 2019-04-28 NOTE — Progress Notes (Signed)
Mr. Schum is a 38 year old male here for follow-up after excision of posterior neck/scalp keloids with complex closure as well as keloid injections to anterior chest/shoulder keloids.  He is 2 weeks postop today.  He reports overall he is doing well.  He is very happy with his progress.  He reports that it has started to itch over the past few days.  The incisions are healing well, C/D/I, no surrounding erythema, no dehiscence noted.  Sutures in place.  He has not noticed a change in body keloids, which is expected as it takes multiple steroid injections for a change.   No fever, chills, nausea, vomiting.  A/P  Mr. Megan Salon is doing really well, he is pleased with his progress. He can apply Vaseline to the incisions daily for a week.  He can begin cutting his hair as normal in 1 week.  If patient would like additional steroid injection for body keloids, he can follow-up in 4 weeks with Dr. Claudia Desanctis   Call with questions or concerns.

## 2019-05-19 ENCOUNTER — Telehealth: Payer: Worker's Compensation

## 2019-06-06 ENCOUNTER — Telehealth: Payer: Worker's Compensation

## 2019-06-13 ENCOUNTER — Telehealth: Payer: Self-pay

## 2019-06-13 NOTE — Telephone Encounter (Signed)
Patient called stating the keloid scars on his neck are painful ar night. He recalls discussing a steroid cream at his last visit and would like to know if he can have some prescribed.

## 2019-06-13 NOTE — Telephone Encounter (Signed)
Returned patients call. He will be scheduled next week to see Dr. Arita Miss for a Keloid injection.

## 2019-06-22 ENCOUNTER — Encounter: Payer: Self-pay | Admitting: Plastic Surgery

## 2019-06-22 ENCOUNTER — Ambulatory Visit (INDEPENDENT_AMBULATORY_CARE_PROVIDER_SITE_OTHER): Payer: BC Managed Care – PPO | Admitting: Plastic Surgery

## 2019-06-22 ENCOUNTER — Other Ambulatory Visit: Payer: Self-pay

## 2019-06-22 VITALS — BP 124/86 | HR 76 | Temp 98.4°F | Ht 68.0 in | Wt 220.8 lb

## 2019-06-22 DIAGNOSIS — L910 Hypertrophic scar: Secondary | ICD-10-CM

## 2019-06-22 NOTE — Progress Notes (Signed)
   Referring Provider Maud Deed, PA [address] [place],  [place] [address]   CC:  Chief Complaint  Patient presents with  . Follow-up    follow up for keloid shot of the skin      Benjamin Gomez is an 39 y.o. male.  HPI: Patient is now 2 months out from excision of keloids from his scalp with complex closure.  I also injected some other keloids in his chest and shoulders.  He has been doing well until recently noticing a painful spot in his right occipital scalp.  He noticed this after shaving his head.  He has not noted any drainage but wants to see if anything can be done about it.  Review of Systems General: Denies fevers, chills  Physical Exam Vitals with BMI 06/22/2019 04/28/2019 04/15/2019  Height 5\' 8"  5\' 8"  -  Weight 220 lbs 13 oz 218 lbs 13 oz -  BMI 33.58 33.28 -  Systolic 124 130  Diastolic 86 84 84  Pulse 76 80 80    General:  No acute distress,  Alert and oriented, Non-Toxic, Normal speech and affect Patient is healing quite well from excision of his keloids.  I do not see any keloids reforming at this point in the area of excision.  He does have a small pustule along one of his incision lines that I opened up and it drained about 1 cc of purulent fluid.  I was not able to find any suture knots at the base of this but would suspect that this was a suture granuloma or ingrown hair.  Assessment/Plan Patient presents with a small pustule along his incision line after excision of keloids with complex closure.  Hopefully drainage of the small area will alleviate his symptoms.  I do not see any reason to give him a steroid injection at this point.  He is following up next month and knows he can come back sooner if he has any further issues.  06/22/2019, 11:52 AM

## 2019-11-28 ENCOUNTER — Ambulatory Visit (HOSPITAL_COMMUNITY)
Admission: EM | Admit: 2019-11-28 | Discharge: 2019-11-28 | Disposition: A | Discharge status: Home / Self Care | Payer: Medicaid Other | Attending: Family Medicine | Admitting: Family Medicine

## 2019-11-28 ENCOUNTER — Emergency Department (HOSPITAL_COMMUNITY)
Admission: EM | Admit: 2019-11-28 | Discharge: 2019-11-28 | Disposition: A | Discharge status: Home / Self Care | Payer: BC Managed Care – PPO | Attending: Emergency Medicine | Admitting: Emergency Medicine

## 2019-11-28 ENCOUNTER — Other Ambulatory Visit: Payer: Self-pay

## 2019-11-28 ENCOUNTER — Encounter (HOSPITAL_COMMUNITY): Payer: Self-pay | Admitting: Emergency Medicine

## 2019-11-28 ENCOUNTER — Encounter (HOSPITAL_COMMUNITY): Payer: Self-pay

## 2019-11-28 DIAGNOSIS — J45909 Unspecified asthma, uncomplicated: Secondary | ICD-10-CM | POA: Insufficient documentation

## 2019-11-28 DIAGNOSIS — Z5321 Procedure and treatment not carried out due to patient leaving prior to being seen by health care provider: Secondary | ICD-10-CM | POA: Insufficient documentation

## 2019-11-28 DIAGNOSIS — J4521 Mild intermittent asthma with (acute) exacerbation: Secondary | ICD-10-CM

## 2019-11-28 MED ORDER — ALBUTEROL SULFATE HFA 108 (90 BASE) MCG/ACT IN AERS: 1.0000 | INHALATION_SPRAY | Freq: Four times a day (QID) | RESPIRATORY_TRACT | 0 refills | Status: AC | PRN

## 2019-11-28 MED ORDER — ALBUTEROL SULFATE (2.5 MG/3ML) 0.083% IN NEBU: 5.0000 mg | INHALATION_SOLUTION | Freq: Once | RESPIRATORY_TRACT | Status: DC

## 2019-11-28 MED ORDER — ALBUTEROL SULFATE (2.5 MG/3ML) 0.083% IN NEBU: 2.5000 mg | INHALATION_SOLUTION | Freq: Four times a day (QID) | RESPIRATORY_TRACT | 0 refills | Status: AC | PRN

## 2019-11-28 MED ORDER — PREDNISONE 20 MG PO TABS: 40.0000 mg | ORAL_TABLET | Freq: Every day | ORAL | 0 refills | Status: AC

## 2019-11-28 NOTE — ED Notes (Signed)
Pt stated that he was leaving. Didn't say why then walked out

## 2019-11-28 NOTE — ED Provider Notes (Signed)
[facility]    CSN: [id number] Arrival date & time: 11/28/19  6948      History   Chief Complaint Chief Complaint  Patient presents with  . Shortness of Breath    HPI Benjamin Gomez is a 39 y.o. male.   Benjamin Gomez presents with complaints of asthma flair with wheezing and shortness of breath for the past three days. Was exposed to shellfish while at work, which he is allergic to. Has been using an albuterol inhaler which has helped. Has daily allergy medications he has been taking. No other cough, congestion, sore throat, fevers, body aches or gi symptoms. Has used a nebulizer in the past which has helped but no longer has. No known ill contacts. No rash. No throat swelling itching or difficulty swallowing.     ROS per HPI, negative if not otherwise mentioned.      Past Medical History:  Diagnosis Date  . Asthma   . Keloid skin disorder    nuchal region  . Speech impediment     Patient Active Problem List   Diagnosis Date Noted  . Speech impediment   . Keloid skin disorder     Past Surgical History:  Procedure Laterality Date  . LIPOMA EXCISION N/A 04/15/2019   Procedure: Excision of posterior neck/scalp keloids with complex closure;  Surgeon: Allena Napoleon, MD;  Location: [facility];  Service: Plastics;  Laterality: N/A;  . SKIN SPLIT GRAFT Right 04/15/2019   Procedure: keloid injection to anterior chest/shoulder;  Surgeon: Allena Napoleon, MD;  Location: [facility];  Service: Plastics;  Laterality: Right;       Home Medications    Prior to Admission medications   Medication Sig Start Date End Date Taking? Authorizing Provider  albuterol (PROVENTIL,VENTOLIN) 90 MCG/ACT inhaler Inhale 2 puffs into the lungs every 6 (six) hours as needed. Shortness of breath and wheezing    Yes [provider]  albuterol (PROAIR HFA) 108 (90 Base) MCG/ACT inhaler Inhale 1-2 puffs into the lungs every 6 (six) hours as  needed for wheezing or shortness of breath. 11/28/19   Georgetta Haber, NP  albuterol (PROVENTIL) (2.5 MG/3ML) 0.083% nebulizer solution Take 3 mLs (2.5 mg total) by nebulization every 6 (six) hours as needed for wheezing or shortness of breath. 11/28/19   Linus Mako B, NP  EPINEPHrine 0.3 mg/0.3 mL IJ SOAJ injection Inject into the muscle. 11/04/17   [provider]  fluticasone (FLONASE) 50 MCG/ACT nasal spray Place into the nose. 05/19/19   [provider]  predniSONE (DELTASONE) 20 MG tablet Take 2 tablets (40 mg total) by mouth daily with breakfast for 5 days. 11/28/19 12/03/19  Georgetta Haber, NP    Family History Family History  Problem Relation Age of Onset  . Diabetes Mother   . Cancer Maternal Grandmother        breast cancer x 2    Social History Social History   Tobacco Use  . Smoking status: Never Smoker  . Smokeless tobacco: Never Used  Substance Use Topics  . Alcohol use: Yes    Alcohol/week: 2.0 standard drinks    Types: 2 Cans of beer per week  . Drug use: No     Allergies   Shellfish allergy, Ibuprofen, and Naproxen sodium   Review of Systems Review of Systems   Physical Exam Triage Vital Signs ED Triage Vitals  Enc Vitals Group     BP 11/28/19 1025 133/80  Pulse Rate 11/28/19 1025 76     Resp 11/28/19 1025 17     Temp 11/28/19 1025 98.8 F (37.1 C)     Temp Source 11/28/19 1025 Oral     SpO2 11/28/19 1025 100 %     Weight --      Height --      Head Circumference --      Peak Flow --      Pain Score 11/28/19 1029 0     Pain Loc --      Pain Edu? --      Excl. in GC? --    No data found.  Updated Vital Signs BP 133/80 (BP Location: Left Arm)   Pulse 76   Temp 98.8 F (37.1 C) (Oral)   Resp 17   SpO2 100%    Physical Exam Constitutional:      Appearance: He is well-developed.  Cardiovascular:     Rate and Rhythm: Normal rate.  Pulmonary:     Effort: Pulmonary effort is normal.     Breath sounds:  Examination of the left-lower field reveals wheezing. Wheezing present.  Skin:    General: Skin is warm and dry.  Neurological:     Mental Status: He is alert and oriented to person, place, and time.      UC Treatments / Results  Labs (all labs ordered are listed, but only abnormal results are displayed) Labs Reviewed - No data to display  EKG   Radiology No results found.  Procedures Procedures (including critical care time)  Medications Ordered in UC Medications - No data to display  Initial Impression / Assessment and Plan / UC Course  I have reviewed the triage vital signs and the nursing notes.  Pertinent labs & imaging results that were available during my care of the patient were reviewed by me and considered in my medical decision making (see chart for details).     Faint LLL wheezing noted. No increased work of breathing. No facial swelling or rash. Prednisone course provided as was exposed to known allergen prior to onset. Return precautions provided. Patient verbalized understanding and agreeable to plan.   Final Clinical Impressions(s) / UC Diagnoses   Final diagnoses:  Mild intermittent asthma with exacerbation     Discharge Instructions     Continue with your daily allergy medication.  5 days of prednisone to help with wheezing.  Use of inhaler or nebulizer as needed for wheezing or shortness of breath.   Please follow up with your PCP for long term management.  Return for any worsening or persistent symptoms.    ED Prescriptions    Medication Sig Dispense Auth. Provider   albuterol (PROAIR HFA) 108 (90 Base) MCG/ACT inhaler Inhale 1-2 puffs into the lungs every 6 (six) hours as needed for wheezing or shortness of breath. 8 g Linus Mako B, NP   albuterol (PROVENTIL) (2.5 MG/3ML) 0.083% nebulizer solution Take 3 mLs (2.5 mg total) by nebulization every 6 (six) hours as needed for wheezing or shortness of breath. 75 mL Linus Mako B, NP    predniSONE (DELTASONE) 20 MG tablet Take 2 tablets (40 mg total) by mouth daily with breakfast for 5 days. 10 tablet Georgetta Haber, NP     PDMP not reviewed this encounter.   Georgetta Haber, NP 11/28/19 1312

## 2019-11-28 NOTE — ED Triage Notes (Signed)
Pt. Stated, I ate some shell fish on Friday which triggered my asthma and my inhaler is not working.

## 2019-11-28 NOTE — ED Triage Notes (Signed)
Pt c/o SOB since Friday when he was exposed to shellfish at work and had mild allergic reaction. Pt states he has been using his inhaler but remains feeling "tight" and SOB. Reports mild dry cough since reaction. Denies facial swelling, dysphagia during allergic reaction. Does not use spacer with MDI albuterol inhaler. Last inhaler use was last night. Denies congestion, runny nose, sore throat, abdom pain, fever, chills.   Pt request nebulizer machine for home use if possible.  LLL with mild inspiratory wheeze, slightly diminished air exchage at bilateral bases; otherwise CTA.

## 2019-11-28 NOTE — Discharge Instructions (Signed)
Continue with your daily allergy medication.  5 days of prednisone to help with wheezing.  Use of inhaler or nebulizer as needed for wheezing or shortness of breath.   Please follow up with your PCP for long term management.  Return for any worsening or persistent symptoms.

## 2021-02-20 ENCOUNTER — Emergency Department (HOSPITAL_BASED_OUTPATIENT_CLINIC_OR_DEPARTMENT_OTHER)
Admission: EM | Admit: 2021-02-20 | Discharge: 2021-02-20 | Disposition: A | Discharge status: Home / Self Care | Payer: Worker's Compensation | Attending: Emergency Medicine | Admitting: Emergency Medicine

## 2021-02-20 ENCOUNTER — Encounter (HOSPITAL_BASED_OUTPATIENT_CLINIC_OR_DEPARTMENT_OTHER): Payer: Self-pay | Admitting: Obstetrics and Gynecology

## 2021-02-20 ENCOUNTER — Other Ambulatory Visit: Payer: Self-pay

## 2021-02-20 ENCOUNTER — Emergency Department (HOSPITAL_BASED_OUTPATIENT_CLINIC_OR_DEPARTMENT_OTHER): Payer: Worker's Compensation | Admitting: Radiology

## 2021-02-20 DIAGNOSIS — J45909 Unspecified asthma, uncomplicated: Secondary | ICD-10-CM | POA: Diagnosis not present

## 2021-02-20 DIAGNOSIS — Y9289 Other specified places as the place of occurrence of the external cause: Secondary | ICD-10-CM | POA: Diagnosis not present

## 2021-02-20 DIAGNOSIS — Z7951 Long term (current) use of inhaled steroids: Secondary | ICD-10-CM | POA: Diagnosis not present

## 2021-02-20 DIAGNOSIS — W19XXXA Unspecified fall, initial encounter: Secondary | ICD-10-CM | POA: Diagnosis not present

## 2021-02-20 DIAGNOSIS — S99912A Unspecified injury of left ankle, initial encounter: Secondary | ICD-10-CM | POA: Diagnosis present

## 2021-02-20 DIAGNOSIS — S93402A Sprain of unspecified ligament of left ankle, initial encounter: Secondary | ICD-10-CM | POA: Diagnosis not present

## 2021-02-20 NOTE — ED Provider Notes (Signed)
[facility] EMERGENCY DEPT Provider Note   CSN: [id number] Arrival date & time: 02/20/21  1627     History Chief Complaint  Patient presents with   Fall   Foot Pain    Benjamin Gomez is a 40 y.o. male.  40 year old male presents with complaint of pain in his left heel and ankle after falling at work yesterday.  Patient states that he was trying to push a bucket back when he started to fall backwards and then fell forwards.  States he has general body aches but particularly painful through his medial ankle and into his calcaneus.  Patient is able to bear weight without difficulty.  No prior injuries to same ankle.  No other injuries, complaints or concerns.      Past Medical History:  Diagnosis Date   Asthma    Keloid skin disorder    nuchal region   Speech impediment     Patient Active Problem List   Diagnosis Date Noted   Speech impediment    Keloid skin disorder     Past Surgical History:  Procedure Laterality Date   LIPOMA EXCISION N/A 04/15/2019   Procedure: Excision of posterior neck/scalp keloids with complex closure;  Surgeon: Allena Napoleon, MD;  Location: [facility];  Service: Plastics;  Laterality: N/A;   SKIN SPLIT GRAFT Right 04/15/2019   Procedure: keloid injection to anterior chest/shoulder;  Surgeon: Allena Napoleon, MD;  Location: [facility];  Service: Plastics;  Laterality: Right;       Family History  Problem Relation Age of Onset   Diabetes Mother    Cancer Maternal Grandmother        breast cancer x 2    Social History   Tobacco Use   Smoking status: Never   Smokeless tobacco: Never  Substance Use Topics   Alcohol use: Yes    Alcohol/week: 2.0 standard drinks    Types: 2 Cans of beer per week   Drug use: No    Home Medications Prior to Admission medications   Medication Sig Start Date End Date Taking? Authorizing Provider  albuterol (PROAIR HFA) 108 (90 Base) MCG/ACT inhaler Inhale 1-2  puffs into the lungs every 6 (six) hours as needed for wheezing or shortness of breath. 11/28/19   Georgetta Haber, NP  albuterol (PROVENTIL) (2.5 MG/3ML) 0.083% nebulizer solution Take 3 mLs (2.5 mg total) by nebulization every 6 (six) hours as needed for wheezing or shortness of breath. 11/28/19   Georgetta Haber, NP  albuterol (PROVENTIL,VENTOLIN) 90 MCG/ACT inhaler Inhale 2 puffs into the lungs every 6 (six) hours as needed. Shortness of breath and wheezing     [provider]  EPINEPHrine 0.3 mg/0.3 mL IJ SOAJ injection Inject into the muscle. 11/04/17   [provider]  fluticasone (FLONASE) 50 MCG/ACT nasal spray Place into the nose. 05/19/19   [provider]    Allergies    Shellfish allergy, Ibuprofen, and Naproxen sodium  Review of Systems   Review of Systems  Constitutional:  Negative for fever.  Musculoskeletal:  Positive for arthralgias and myalgias. Negative for joint swelling.  Skin:  Negative for color change, rash and wound.  Allergic/Immunologic: Negative for immunocompromised state.  Neurological:  Negative for weakness and numbness.  Hematological:  Does not bruise/bleed easily.  Psychiatric/Behavioral:  Negative for self-injury.   All other systems reviewed and are negative.  Physical Exam Updated Vital Signs BP 131/90   Pulse 78   Temp  98.4 F (36.9 C) (Oral)   Resp 15   Ht 5\' 8"  (1.727 m)   Wt 99.8 kg   SpO2 97%   BMI 33.45 kg/m   Physical Exam Vitals and nursing note reviewed.  Constitutional:      General: He is not in acute distress.    Appearance: He is well-developed. He is not diaphoretic.  HENT:     Head: Normocephalic and atraumatic.  Cardiovascular:     Pulses: Normal pulses.  Pulmonary:     Effort: Pulmonary effort is normal.  Musculoskeletal:        General: Tenderness present. No swelling or deformity.     Right ankle: Tenderness present over the medial malleolus. No lateral malleolus, base of 5th metatarsal or  proximal fibula tenderness. Normal range of motion. Normal pulse.     Right Achilles Tendon: Normal.     Comments: Tenderness to medial malleolus as well as left calcaneus.  DP pulse present.  Sensation intact, Normal strength.  Skin:    General: Skin is warm and dry.     Capillary Refill: Capillary refill takes less than 2 seconds.     Findings: No erythema or rash.  Neurological:     Mental Status: He is alert and oriented to person, place, and time.     Sensory: No sensory deficit.     Motor: No weakness.  Psychiatric:        Behavior: Behavior normal.    ED Results / Procedures / Treatments   Labs (all labs ordered are listed, but only abnormal results are displayed) Labs Reviewed - No data to display  EKG None  Radiology DG Ankle Complete Left  Result Date: 02/20/2021 CLINICAL DATA:  Fall.  Foot pain EXAM: LEFT ANKLE COMPLETE - 3+ VIEW COMPARISON:  Left os calcis 02/20/2021 FINDINGS: There is no evidence of fracture, dislocation, or joint effusion. There is no evidence of arthropathy or other focal bone abnormality. Soft tissues are unremarkable. IMPRESSION: Negative. Electronically Signed   By: 04/22/2021 M.D.   On: 02/20/2021 17:43   DG Os Calcis Left  Result Date: 02/20/2021 CLINICAL DATA:  Fall.  Foot pain EXAM: LEFT OS CALCIS - 2+ VIEW COMPARISON:  None. FINDINGS: There is no evidence of fracture or other focal bone lesions. Soft tissues are unremarkable. IMPRESSION: Negative. Electronically Signed   By: 06-07-1969 M.D.   On: 02/20/2021 17:44    Procedures Procedures   Medications Ordered in ED Medications - No data to display  ED Course  I have reviewed the triage vital signs and the nursing notes.  Pertinent labs & imaging results that were available during my care of the patient were reviewed by me and considered in my medical decision making (see chart for details).  Clinical Course as of 02/20/21 1749  Wed Feb 20, 2021  2339 40 year old male with  complaint of left ankle pain after fall at work last night.  He has tenderness to the medial malleolus as well as the calcaneus.  X-rays of same were negative for acute injury.  Patient is placed in an ankle ASO, recommend Motrin, Tylenol, ice and elevate.  Recheck with Worker's Comp. provider in 2 days. [LM]    Clinical Course User Index [LM] 24   MDM Rules/Calculators/A&P                           Final Clinical Impression(s) / ED Diagnoses Final diagnoses:  Fall, initial encounter  Sprain of left ankle, unspecified ligament, initial encounter    Rx / DC Orders ED Discharge Orders     None        Jeannie Fend, PA-C 02/20/21 1749    Virgina Norfolk, DO 02/20/21 1811

## 2021-02-20 NOTE — ED Notes (Signed)
ED Provider at bedside. 

## 2021-02-20 NOTE — ED Triage Notes (Signed)
Patient reports to the ER for fall and foot pain. Patient reports he had a fall last night at work and feels like his left foot is "extra sore" and the rest of his body is "sore".

## 2021-02-20 NOTE — Discharge Instructions (Signed)
Recheck with your Worker's Comp. provider in 2 days.  Take Motrin and Tylenol as needed as directed.  You can apply ice for 20 minutes at a time and elevate as needed for pain and swelling.

## 2021-03-12 ENCOUNTER — Other Ambulatory Visit: Payer: Self-pay

## 2021-03-12 ENCOUNTER — Encounter (HOSPITAL_BASED_OUTPATIENT_CLINIC_OR_DEPARTMENT_OTHER): Payer: Self-pay

## 2021-03-12 ENCOUNTER — Emergency Department (HOSPITAL_BASED_OUTPATIENT_CLINIC_OR_DEPARTMENT_OTHER)
Admission: EM | Admit: 2021-03-12 | Discharge: 2021-03-12 | Disposition: A | Discharge status: Home / Self Care | Payer: BC Managed Care – PPO | Attending: Student | Admitting: Student

## 2021-03-12 DIAGNOSIS — Z7951 Long term (current) use of inhaled steroids: Secondary | ICD-10-CM | POA: Diagnosis not present

## 2021-03-12 DIAGNOSIS — J45909 Unspecified asthma, uncomplicated: Secondary | ICD-10-CM | POA: Diagnosis not present

## 2021-03-12 DIAGNOSIS — U071 COVID-19: Secondary | ICD-10-CM | POA: Diagnosis not present

## 2021-03-12 DIAGNOSIS — Z8616 Personal history of COVID-19: Secondary | ICD-10-CM | POA: Insufficient documentation

## 2021-03-12 DIAGNOSIS — R519 Headache, unspecified: Secondary | ICD-10-CM | POA: Diagnosis present

## 2021-03-12 LAB — RESP PANEL BY RT-PCR (FLU A&B, COVID) ARPGX2
Influenza A by PCR: NEGATIVE
Influenza B by PCR: NEGATIVE
SARS Coronavirus 2 by RT PCR: POSITIVE — AB

## 2021-03-12 NOTE — ED Triage Notes (Signed)
Patient here POV from Home with Headaches, Generalized Body Aches, and Headache.  Patient was in recent contact with COVID-19 Positive Person.   NAD Noted during Triage. A&Ox4. GCS 15. Ambulatory.

## 2021-03-12 NOTE — ED Notes (Signed)
D/c paperwork reviewed with pt.  No questions or concerns at time of d/c. Ambulatory to ED exit on RA.

## 2021-03-13 NOTE — ED Provider Notes (Signed)
[facility] EMERGENCY DEPT Provider Note   CSN: [id number] Arrival date & time: 03/12/21  1824     History Chief Complaint  Patient presents with   Generalized Body Aches    Benjamin Gomez is a 40 y.o. male who presents emergency department for evaluation of headache, cough and generalized body aches.  Patient arrives with 2 family members with similar symptoms.  He has a 32-month at home that tested positive for COVID-19.  Denies chest pain, shortness of breath, Donnell pain, nausea, vomiting or other systemic symptoms.  HPI     Past Medical History:  Diagnosis Date   Asthma    Keloid skin disorder    nuchal region   Speech impediment     Patient Active Problem List   Diagnosis Date Noted   Speech impediment    Keloid skin disorder     Past Surgical History:  Procedure Laterality Date   LIPOMA EXCISION N/A 04/15/2019   Procedure: Excision of posterior neck/scalp keloids with complex closure;  Surgeon: [name], MD;  Location: [facility];  Service: Plastics;  Laterality: N/A;   SKIN SPLIT GRAFT Right 04/15/2019   Procedure: keloid injection to anterior chest/shoulder;  Surgeon: [name], MD;  Location: [facility];  Service: Plastics;  Laterality: Right;       Family History  Problem Relation Age of Onset   Diabetes Mother    Cancer Maternal Grandmother        breast cancer x 2    Social History   Tobacco Use   Smoking status: Never   Smokeless tobacco: Never  Substance Use Topics   Alcohol use: Yes    Alcohol/week: 2.0 standard drinks    Types: 2 Cans of beer per week   Drug use: No    Home Medications Prior to Admission medications   Medication Sig Start Date End Date Taking? Authorizing Provider  albuterol (PROAIR HFA) 108 (90 Base) MCG/ACT inhaler Inhale 1-2 puffs into the lungs every 6 (six) hours as needed for wheezing or shortness of breath. 11/28/19   Zigmund Gottron, NP  albuterol  (PROVENTIL) (2.5 MG/3ML) 0.083% nebulizer solution Take 3 mLs (2.5 mg total) by nebulization every 6 (six) hours as needed for wheezing or shortness of breath. 11/28/19   Zigmund Gottron, NP  albuterol (PROVENTIL,VENTOLIN) 90 MCG/ACT inhaler Inhale 2 puffs into the lungs every 6 (six) hours as needed. Shortness of breath and wheezing     [provider]  EPINEPHrine 0.3 mg/0.3 mL IJ SOAJ injection Inject into the muscle. 11/04/17   [provider]  fluticasone (FLONASE) 50 MCG/ACT nasal spray Place into the nose. 05/19/19   [provider]    Allergies    Shellfish allergy, Ibuprofen, and Naproxen sodium  Review of Systems   Review of Systems  Constitutional:  Positive for fatigue and fever. Negative for chills.  HENT:  Negative for ear pain and sore throat.   Eyes:  Negative for pain and visual disturbance.  Respiratory:  Positive for cough. Negative for shortness of breath.   Cardiovascular:  Negative for chest pain and palpitations.  Gastrointestinal:  Negative for abdominal pain and vomiting.  Genitourinary:  Negative for dysuria and hematuria.  Musculoskeletal:  Negative for arthralgias and back pain.  Skin:  Negative for color change and rash.  Neurological:  Positive for headaches. Negative for seizures and syncope.  All other systems reviewed and are negative.  Physical Exam Updated Vital Signs  BP 124/90 (BP Location: Right Arm)   Pulse 100   Temp 99.6 F (37.6 C) (Oral)   Resp 12   Ht 5\' 8"  (1.727 m)   Wt 99.8 kg   SpO2 97%   BMI 33.45 kg/m   Physical Exam Vitals and nursing note reviewed.  Constitutional:      Appearance: He is well-developed.  HENT:     Head: Normocephalic and atraumatic.  Eyes:     Conjunctiva/sclera: Conjunctivae normal.  Cardiovascular:     Rate and Rhythm: Normal rate and regular rhythm.     Heart sounds: No murmur heard. Pulmonary:     Effort: Pulmonary effort is normal. No respiratory distress.     Breath  sounds: Normal breath sounds.  Abdominal:     Palpations: Abdomen is soft.     Tenderness: There is no abdominal tenderness.  Musculoskeletal:     Cervical back: Neck supple.  Skin:    General: Skin is warm and dry.  Neurological:     Mental Status: He is alert.    ED Results / Procedures / Treatments   Labs (all labs ordered are listed, but only abnormal results are displayed) Labs Reviewed  RESP PANEL BY RT-PCR (FLU A&B, COVID) ARPGX2 - Abnormal; Notable for the following components:      Result Value   SARS Coronavirus 2 by RT PCR POSITIVE (*)    All other components within normal limits    EKG None  Radiology No results found.  Procedures Procedures   Medications Ordered in ED Medications - No data to display  ED Course  I have reviewed the triage vital signs and the nursing notes.  Pertinent labs & imaging results that were available during my care of the patient were reviewed by me and considered in my medical decision making (see chart for details).    MDM Rules/Calculators/A&P                           Patient seen in the emergency department for evaluation of URI symptoms.  Physical exam is unremarkable.  Patient is COVID-positive.  Patient is not a candidate for paxlovid therapy at this time.  He is overall well-appearing and safe for discharge.  Patient then discharged. Final Clinical Impression(s) / ED Diagnoses Final diagnoses:  COVID-19    Rx / DC Orders ED Discharge Orders     None        Tesslyn Baumert, MD 03/13/21 0030

## 2021-12-19 ENCOUNTER — Other Ambulatory Visit: Payer: Self-pay

## 2021-12-19 ENCOUNTER — Encounter: Payer: Self-pay | Admitting: Emergency Medicine

## 2021-12-19 ENCOUNTER — Emergency Department
Admission: EM | Admit: 2021-12-19 | Discharge: 2021-12-19 | Disposition: A | Discharge status: Home / Self Care | Payer: BC Managed Care – PPO | Attending: Emergency Medicine | Admitting: Emergency Medicine

## 2021-12-19 DIAGNOSIS — K0889 Other specified disorders of teeth and supporting structures: Secondary | ICD-10-CM | POA: Insufficient documentation

## 2021-12-19 MED ORDER — CHLORHEXIDINE GLUCONATE 0.12 % MT SOLN: 15.0000 mL | Freq: Two times a day (BID) | OROMUCOSAL | 0 refills | Status: AC

## 2021-12-19 MED ORDER — PENICILLIN V POTASSIUM 500 MG PO TABS: 500.0000 mg | ORAL_TABLET | Freq: Four times a day (QID) | ORAL | 0 refills | Status: AC

## 2021-12-19 MED ORDER — ACETAMINOPHEN 325 MG PO TABS
650.0000 mg | ORAL_TABLET | Freq: Once | ORAL | Status: AC
  Administered 2021-12-19: 650 mg via ORAL
  Filled 2021-12-19: qty 2

## 2021-12-19 NOTE — Discharge Instructions (Signed)
Please follow-up with your dentist as you have scheduled.  Take the antibiotics and using mouth rinse as prescribed.  Please return for any new, worsening, or change in symptoms or other concerns.  It was a pleasure caring for you today.

## 2021-12-19 NOTE — ED Triage Notes (Signed)
Pt to ED via POV for dental pain for the last few days. Pain is worse today than it has been.

## 2021-12-19 NOTE — ED Provider Notes (Signed)
[facility] Provider Note    Event Date/Time   First MD Initiated Contact with Patient 12/19/21 1352     (approximate)   History   Dental Pain   HPI  Benjamin Gomez is a 41 y.o. male who presents today for evaluation of dental pain.  Patient reports that this began approximately 1 week ago but worsened overnight.  He has an appointment next week with a dentist but they advised that he come here for antibiotics.  He reports that he has a chipped tooth in his left lower posterior molar.  He has not had any trouble eating or drinking.  No fevers or chills.  He has not noticed any facial or neck swelling.  Patient Active Problem List   Diagnosis Date Noted   Speech impediment    Keloid skin disorder           Physical Exam   Triage Vital Signs: ED Triage Vitals  Enc Vitals Group     BP 12/19/21 1330 (!) 154/102     Pulse Rate 12/19/21 1328 71     Resp 12/19/21 1328 16     Temp 12/19/21 1328 98.8 F (37.1 C)     Temp Source 12/19/21 1328 Oral     SpO2 12/19/21 1328 98 %     Weight --      Height --      Head Circumference --      Peak Flow --      Pain Score 12/19/21 1328 10     Pain Loc --      Pain Edu? --      Excl. in GC? --     Most recent vital signs: Vitals:   12/19/21 1328 12/19/21 1330  BP:  (!) 154/102  Pulse: 71   Resp: 16   Temp: 98.8 F (37.1 C)   SpO2: 98%     Physical Exam Vitals and nursing note reviewed.  Constitutional:      General: Awake and alert. No acute distress.    Appearance: Normal appearance. The patient is normal weight.  HENT:     Head: Normocephalic and atraumatic.     Mouth: Mucous membranes are moist.  Fairly good dentition throughout.  Left posterior molar with small broken portion down to the gums.  No gingival fluctuance.  Mild tap tenderness.  Sublingual swelling.  No facial or neck erythema or swelling.  No trismus.  No voice change.  No nuchal rigidity Eyes:     General: PERRL. Normal EOMs         Right eye: No discharge.        Left eye: No discharge.     Conjunctiva/sclera: Conjunctivae normal.  Cardiovascular:     Rate and Rhythm: Normal rate and regular rhythm.     Pulses: Normal pulses.  Pulmonary:     Effort: Pulmonary effort is normal. No respiratory distress.  Abdominal:     Abdomen is soft. There is no abdominal tenderness. No rebound or guarding. No distention. Musculoskeletal:        General: No swelling. Normal range of motion.     Cervical back: Normal range of motion and neck supple.  Skin:    General: Skin is warm and dry.     Capillary Refill: Capillary refill takes less than 2 seconds.     Findings: No rash.  Neurological:     Mental Status: The patient is awake and alert.  ED Results / Procedures / Treatments   Labs (all labs ordered are listed, but only abnormal results are displayed) Labs Reviewed - No data to display   EKG     RADIOLOGY     PROCEDURES:  Critical Care performed:   Procedures   MEDICATIONS ORDERED IN ED: Medications  acetaminophen (TYLENOL) tablet 650 mg (650 mg Oral Given 12/19/21 1428)     IMPRESSION / MDM / ASSESSMENT AND PLAN / ED COURSE  I reviewed the triage vital signs and the nursing notes.   Differential diagnosis includes, but is not limited to, dental fracture, dental caries, pulpitits. No gingival swelling or fluctuance concerning for gingival abscess.  No trismus, nuchal rigidity, neck pain, hot potato voice, uvular deviation or malocclusion to suggest deep space infection. No sublingual swelling concerning for Ludwig's angina.  Patient was started on antibiotics and chlorhexidine mouth rinse.  Patient was treated symptomatically in the emergency department. Discussed care plan, return precautions, and advised close outpatient follow-up with dentist. Patient agrees with plan of care.    Patient's presentation is most consistent with acute, uncomplicated illness.    FINAL CLINICAL  IMPRESSION(S) / ED DIAGNOSES   Final diagnoses:  Pain, dental     Rx / DC Orders   ED Discharge Orders          Ordered    penicillin v potassium (VEETID) 500 MG tablet  4 times daily        12/19/21 1416    chlorhexidine (PERIDEX) 0.12 % solution  2 times daily        12/19/21 1416             Note:  This document was prepared using Dragon voice recognition software and may include unintentional dictation errors.   Keturah Shavers 12/19/21 1440    Chesley Noon, MD 12/19/21 1946

## 2021-12-20 ENCOUNTER — Emergency Department
Admission: EM | Admit: 2021-12-20 | Discharge: 2021-12-20 | Disposition: A | Discharge status: Home / Self Care | Payer: Medicaid Other | Attending: Emergency Medicine | Admitting: Emergency Medicine

## 2021-12-20 ENCOUNTER — Encounter: Payer: Self-pay | Admitting: Emergency Medicine

## 2021-12-20 ENCOUNTER — Other Ambulatory Visit: Payer: Self-pay

## 2021-12-20 DIAGNOSIS — S025XXD Fracture of tooth (traumatic), subsequent encounter for fracture with routine healing: Secondary | ICD-10-CM

## 2021-12-20 DIAGNOSIS — K047 Periapical abscess without sinus: Secondary | ICD-10-CM

## 2021-12-20 DIAGNOSIS — K0889 Other specified disorders of teeth and supporting structures: Secondary | ICD-10-CM

## 2021-12-20 DIAGNOSIS — X58XXXA Exposure to other specified factors, initial encounter: Secondary | ICD-10-CM | POA: Insufficient documentation

## 2021-12-20 MED ORDER — HYDROCODONE-ACETAMINOPHEN 5-325 MG PO TABS: 1.0000 | ORAL_TABLET | ORAL | 0 refills | Status: AC | PRN

## 2021-12-20 NOTE — ED Triage Notes (Signed)
Pt in with co toothache, states has been here recently for the same. Was put on antibiotics for the same. Here for worsening pain.

## 2021-12-20 NOTE — ED Provider Notes (Signed)
[facility] Provider Note   Event Date/Time   First MD Initiated Contact with Patient 12/20/21 1211     (approximate) History  Dental Pain  HPI Benjamin Gomez is a 41 y.o. male with stated recent past medical history of a left mandibular cracked wisdom tooth that he was seen for 2 days prior to arrival.  Patient was placed on penicillin for prophylaxis possible of infection before seeing the dentist which she has an appointment for in 3 days.  Patient states that this pain has gotten worse after he sneezed today and is currently a 10/10, aching, left jaw pain that radiates down his neck ROS: Patient currently denies any vision changes, tinnitus, difficulty speaking, facial droop, sore throat, chest pain, shortness of breath, abdominal pain, nausea/vomiting/diarrhea, dysuria, or weakness/numbness/paresthesias in any extremity   Physical Exam  Triage Vital Signs: ED Triage Vitals  Enc Vitals Group     BP 12/20/21 1038 (!) 119/108     Pulse Rate 12/20/21 1038 70     Resp 12/20/21 1038 18     Temp 12/20/21 1038 98.6 F (37 C)     Temp Source 12/20/21 1038 Oral     SpO2 12/20/21 1038 100 %     Weight 12/20/21 1039 225 lb (102.1 kg)     Height 12/20/21 1039 5\' 8"  (1.727 m)     Head Circumference --      Peak Flow --      Pain Score 12/20/21 1039 10     Pain Loc --      Pain Edu? --      Excl. in GC? --    Most recent vital signs: Vitals:   12/20/21 1038  BP: (!) 119/108  Pulse: 70  Resp: 18  Temp: 98.6 F (37 C)  SpO2: 100%   General: Awake, oriented x4. CV:  Good peripheral perfusion.  Resp:  Normal effort.  Abd:  No distention.  Other:  Middle-aged African-American male laying in bed in mild distress secondary to pain.  There is a fracture of the left mandibular wisdom tooth down to the gumline medially with mild surrounding erythema and no palpable anterior cervical lymphadenopathy ED Results / Procedures / Treatments  Labs (all labs ordered are  listed, but only abnormal results are displayed) Labs Reviewed - No data to display PROCEDURES: Critical Care performed: No Procedures MEDICATIONS ORDERED IN ED: Medications - No data to display IMPRESSION / MDM / ASSESSMENT AND PLAN / ED COURSE  I reviewed the triage vital signs and the nursing notes.                             The patient is on the cardiac monitor to evaluate for evidence of arrhythmia and/or significant heart rate changes. Patient's presentation is most consistent with acute presentation with potential threat to life or bodily function. Patient not immunosuppressed. No e/o tooth fracture, avulsion, or bleeding socket. No e/o RPA, PTA, Ludwigs angina, periapical abscess. No e/o gingival hyperplasia or concern for drug reaction.  Rx ibuprofen not controlling patient's pain and therefore will start short course of hydrocodone prior to his dentist appointment in 3 days Disposition: Discharge home. Discussed return precautions for odontogenic infections and other dental pain emergencies. Will provide dental clinic list.   FINAL CLINICAL IMPRESSION(S) / ED DIAGNOSES   Final diagnoses:  Open fracture of tooth with routine healing, subsequent encounter  Dental infection  Dentalgia  Rx / DC Orders   ED Discharge Orders          Ordered    HYDROcodone-acetaminophen (NORCO) 5-325 MG tablet  Every 4 hours PRN        12/20/21 1324           Note:  This document was prepared using Dragon voice recognition software and may include unintentional dictation errors.   Merwyn Katos, MD 12/20/21 1341
